# Patient Record
Sex: Female | Born: 1985
Health system: Southern US, Community
[De-identification: ages and names within clinical notes are randomized; demographics above are authoritative.]

## PROBLEM LIST (undated history)

## (undated) DIAGNOSIS — F32A Depression, unspecified: Secondary | ICD-10-CM

## (undated) DIAGNOSIS — F319 Bipolar disorder, unspecified: Secondary | ICD-10-CM

## (undated) DIAGNOSIS — M5430 Sciatica, unspecified side: Secondary | ICD-10-CM

## (undated) DIAGNOSIS — F419 Anxiety disorder, unspecified: Secondary | ICD-10-CM

---

## 2018-03-08 ENCOUNTER — Emergency Department (HOSPITAL_BASED_OUTPATIENT_CLINIC_OR_DEPARTMENT_OTHER): Payer: Medicaid Other

## 2018-03-08 ENCOUNTER — Other Ambulatory Visit: Payer: Self-pay

## 2018-03-08 ENCOUNTER — Emergency Department (HOSPITAL_BASED_OUTPATIENT_CLINIC_OR_DEPARTMENT_OTHER)
Admission: EM | Admit: 2018-03-08 | Discharge: 2018-03-08 | Disposition: A | Discharge status: Home / Self Care | Payer: Medicaid Other | Attending: Emergency Medicine | Admitting: Emergency Medicine

## 2018-03-08 ENCOUNTER — Encounter (HOSPITAL_BASED_OUTPATIENT_CLINIC_OR_DEPARTMENT_OTHER): Payer: Self-pay

## 2018-03-08 DIAGNOSIS — R197 Diarrhea, unspecified: Secondary | ICD-10-CM | POA: Diagnosis not present

## 2018-03-08 DIAGNOSIS — Z3A01 Less than 8 weeks gestation of pregnancy: Secondary | ICD-10-CM | POA: Diagnosis not present

## 2018-03-08 DIAGNOSIS — O219 Vomiting of pregnancy, unspecified: Secondary | ICD-10-CM | POA: Insufficient documentation

## 2018-03-08 DIAGNOSIS — O26891 Other specified pregnancy related conditions, first trimester: Secondary | ICD-10-CM | POA: Insufficient documentation

## 2018-03-08 DIAGNOSIS — R102 Pelvic and perineal pain: Secondary | ICD-10-CM | POA: Diagnosis not present

## 2018-03-08 DIAGNOSIS — Z3490 Encounter for supervision of normal pregnancy, unspecified, unspecified trimester: Secondary | ICD-10-CM

## 2018-03-08 HISTORY — DX: Sciatica, unspecified side: M54.30

## 2018-03-08 LAB — CBC WITH DIFFERENTIAL/PLATELET
Abs Immature Granulocytes: 0.01 10*3/uL (ref 0.00–0.07)
Basophils Absolute: 0 10*3/uL (ref 0.0–0.1)
Basophils Relative: 0 %
Eosinophils Absolute: 0 10*3/uL (ref 0.0–0.5)
Eosinophils Relative: 0 %
HCT: 36.1 % (ref 36.0–46.0)
Hemoglobin: 11.4 g/dL — ABNORMAL LOW (ref 12.0–15.0)
Immature Granulocytes: 0 %
LYMPHS PCT: 34 %
Lymphs Abs: 1.3 10*3/uL (ref 0.7–4.0)
MCH: 27.6 pg (ref 26.0–34.0)
MCHC: 31.6 g/dL (ref 30.0–36.0)
MCV: 87.4 fL (ref 80.0–100.0)
Monocytes Absolute: 0.5 10*3/uL (ref 0.1–1.0)
Monocytes Relative: 13 %
Neutro Abs: 2 10*3/uL (ref 1.7–7.7)
Neutrophils Relative %: 53 %
Platelets: 186 10*3/uL (ref 150–400)
RBC: 4.13 MIL/uL (ref 3.87–5.11)
RDW: 13.3 % (ref 11.5–15.5)
WBC: 3.8 10*3/uL — AB (ref 4.0–10.5)
nRBC: 0 % (ref 0.0–0.2)

## 2018-03-08 LAB — COMPREHENSIVE METABOLIC PANEL
ALT: 9 U/L (ref 0–44)
AST: 14 U/L — ABNORMAL LOW (ref 15–41)
Albumin: 3.9 g/dL (ref 3.5–5.0)
Alkaline Phosphatase: 44 U/L (ref 38–126)
Anion gap: 7 (ref 5–15)
BUN: 7 mg/dL (ref 6–20)
CO2: 24 mmol/L (ref 22–32)
Calcium: 8.6 mg/dL — ABNORMAL LOW (ref 8.9–10.3)
Chloride: 106 mmol/L (ref 98–111)
Creatinine, Ser: 0.82 mg/dL (ref 0.44–1.00)
GFR calc Af Amer: >60 mL/min (ref >60)
GFR calc non Af Amer: >60 mL/min (ref >60)
Glucose, Bld: 104 mg/dL — ABNORMAL HIGH (ref 70–99)
Potassium: 3.5 mmol/L (ref 3.5–5.1)
Sodium: 137 mmol/L (ref 135–145)
Total Bilirubin: 0.4 mg/dL (ref 0.3–1.2)
Total Protein: 6.8 g/dL (ref 6.5–8.1)

## 2018-03-08 LAB — URINALYSIS, MICROSCOPIC (REFLEX)

## 2018-03-08 LAB — URINALYSIS, ROUTINE W REFLEX MICROSCOPIC
Bilirubin Urine: NEGATIVE
Glucose, UA: NEGATIVE mg/dL
Ketones, ur: NEGATIVE mg/dL
NITRITE: POSITIVE — AB
Protein, ur: NEGATIVE mg/dL
Specific Gravity, Urine: 1.025 (ref 1.005–1.030)
pH: 6 (ref 5.0–8.0)

## 2018-03-08 LAB — LIPASE, BLOOD: Lipase: 24 U/L (ref 11–51)

## 2018-03-08 LAB — PREGNANCY, URINE: Preg Test, Ur: POSITIVE — AB

## 2018-03-08 LAB — HCG, QUANTITATIVE, PREGNANCY: hCG, Beta Chain, Quant, S: 12818 m[IU]/mL — ABNORMAL HIGH (ref <5)

## 2018-03-08 MED ORDER — ONDANSETRON HCL 4 MG/2ML IJ SOLN
4.0000 mg | Freq: Once | INTRAMUSCULAR | Status: AC
  Administered 2018-03-08: 4 mg via INTRAVENOUS
  Filled 2018-03-08: qty 2

## 2018-03-08 MED ORDER — SODIUM CHLORIDE 0.9 % IV BOLUS
1000.0000 mL | Freq: Once | INTRAVENOUS | Status: AC
  Administered 2018-03-08: 1000 mL via INTRAVENOUS

## 2018-03-08 MED ORDER — KETOROLAC TROMETHAMINE 30 MG/ML IJ SOLN
30.0000 mg | Freq: Once | INTRAMUSCULAR | Status: AC
  Administered 2018-03-08: 30 mg via INTRAVENOUS
  Filled 2018-03-08: qty 1

## 2018-03-08 NOTE — ED Provider Notes (Signed)
[facility] EMERGENCY DEPARTMENT Provider Note   CSN: [id number] Arrival date & time: 03/08/18  1532    History   Chief Complaint Chief Complaint  Patient presents with  . Abdominal Pain    HPI Megan Yang is a 33 y.o. female.     Patient is a 33 year old female with history of sciatica.  She presents today for evaluation of abdominal pain, nausea, vomiting, and diarrhea.  This is been ongoing for the past 3 days.  All has been nonbloody and nonbilious.  She denies to me she is experiencing any dysuria, abnormal vaginal discharge, or urinary frequency.  She denies any fevers or chills.  She denies any ill contacts.  Her last menstrual period was in January and she believes she is late for her February period.  The history is provided by the patient.  Abdominal Pain  Pain location:  Suprapubic Pain quality: cramping   Pain radiates to:  Does not radiate Pain severity:  Moderate Onset quality:  Gradual Duration:  3 days Timing:  Constant Progression:  Worsening Chronicity:  New Relieved by:  Nothing Worsened by:  Nothing   Past Medical History:  Diagnosis Date  . Sciatica     There are no active problems to display for this patient.   History reviewed. No pertinent surgical history.   OB History   No obstetric history on file.      Home Medications    Prior to Admission medications   Not on File    Family History No family history on file.  Social History Social History   Tobacco Use  . Smoking status: Never Smoker  . Smokeless tobacco: Never Used  Substance Use Topics  . Alcohol use: Yes    Comment: occ  . Drug use: Never     Allergies   Flagyl [metronidazole] and Ivp dye [iodinated diagnostic agents]   Review of Systems Review of Systems  Gastrointestinal: Positive for abdominal pain.  All other systems reviewed and are negative.    Physical Exam Updated Vital Signs BP 110/73 (BP Location: Left Arm)   Pulse 94    Temp 99.1 F (37.3 C) (Oral)   Resp 20   Ht 5\' 5"  (1.651 m)   Wt 99.8 kg   LMP  (LMP Unknown)   SpO2 99%   BMI 36.61 kg/m   Physical Exam Vitals signs and nursing note reviewed.  Constitutional:      General: She is not in acute distress.    Appearance: She is well-developed. She is not diaphoretic.  HENT:     Head: Normocephalic and atraumatic.  Neck:     Musculoskeletal: Normal range of motion and neck supple.  Cardiovascular:     Rate and Rhythm: Normal rate and regular rhythm.     Heart sounds: No murmur. No friction rub. No gallop.   Pulmonary:     Effort: Pulmonary effort is normal. No respiratory distress.     Breath sounds: Normal breath sounds. No wheezing.  Abdominal:     General: Bowel sounds are normal. There is no distension.     Palpations: Abdomen is soft.     Tenderness: There is abdominal tenderness in the suprapubic area. There is no right CVA tenderness or left CVA tenderness.     Comments: There is mild tenderness in the suprapubic region.  Musculoskeletal: Normal range of motion.  Skin:    General: Skin is warm and dry.  Neurological:     Mental Status: She is  alert and oriented to person, place, and time.      ED Treatments / Results  Labs (all labs ordered are listed, but only abnormal results are displayed) Labs Reviewed  URINALYSIS, ROUTINE W REFLEX MICROSCOPIC  PREGNANCY, URINE  COMPREHENSIVE METABOLIC PANEL  LIPASE, BLOOD  CBC WITH DIFFERENTIAL/PLATELET    EKG None  Radiology No results found.  Procedures Procedures (including critical care time)  Medications Ordered in ED Medications  sodium chloride 0.9 % bolus 1,000 mL (has no administration in time range)  ondansetron (ZOFRAN) injection 4 mg (has no administration in time range)  ketorolac (TORADOL) 30 MG/ML injection 30 mg (has no administration in time range)     Initial Impression / Assessment and Plan / ED Course  I have reviewed the triage vital signs and the  nursing notes.  Pertinent labs & imaging results that were available during my care of the patient were reviewed by me and considered in my medical decision making (see chart for details).  Patient presents here with suprapubic abdominal pain, nausea, and vomiting for the past week.  Patient's pregnancy test was positive and this was unbeknownst to her.  She is not having any bleeding and laboratory studies are reassuring.  Ultrasound shows an intrauterine pregnancy with no complicating features, but is early on in gestation.  Radiology is recommending a repeat ultrasound in 10 to 14 days to assess for fetal heart activity.  Patient will be discharged with OB follow-up.  Final Clinical Impressions(s) / ED Diagnoses   Final diagnoses:  None    ED Discharge Orders    None       Geoffery Lyons, MD 03/08/18 [name]

## 2018-03-08 NOTE — ED Triage Notes (Signed)
C/o abd pain n/v/d x 3 days-NAD-steady gait

## 2018-03-08 NOTE — ED Notes (Signed)
PT states understanding of care given, follow up care. PT ambulated from ED to car with a steady gait.  

## 2018-03-08 NOTE — Discharge Instructions (Addendum)
Follow-up with your OB/GYN in the next 10 to 14 days.  Radiology is recommending that you have a repeat ultrasound in the next 2 weeks to further evaluate for fetal heart activity.

## 2018-03-08 NOTE — ED Notes (Signed)
Pt resting on stretcher, texting on phone. No distress noted.

## 2018-03-08 NOTE — ED Notes (Signed)
Patient transported to Ultrasound 

## 2018-03-08 NOTE — ED Notes (Signed)
ED Provider at bedside. 

## 2018-06-09 ENCOUNTER — Other Ambulatory Visit: Payer: Self-pay

## 2018-06-09 ENCOUNTER — Encounter (HOSPITAL_BASED_OUTPATIENT_CLINIC_OR_DEPARTMENT_OTHER): Payer: Self-pay | Admitting: *Deleted

## 2018-06-09 ENCOUNTER — Emergency Department (HOSPITAL_BASED_OUTPATIENT_CLINIC_OR_DEPARTMENT_OTHER)
Admission: EM | Admit: 2018-06-09 | Discharge: 2018-06-09 | Disposition: A | Discharge status: Home / Self Care | Payer: Medicaid Other | Attending: Emergency Medicine | Admitting: Emergency Medicine

## 2018-06-09 DIAGNOSIS — Z79899 Other long term (current) drug therapy: Secondary | ICD-10-CM | POA: Diagnosis not present

## 2018-06-09 DIAGNOSIS — N939 Abnormal uterine and vaginal bleeding, unspecified: Secondary | ICD-10-CM | POA: Insufficient documentation

## 2018-06-09 LAB — PREGNANCY, URINE: Preg Test, Ur: NEGATIVE

## 2018-06-09 LAB — WET PREP, GENITAL
Sperm: NONE SEEN
Trich, Wet Prep: NONE SEEN
Yeast Wet Prep HPF POC: NONE SEEN

## 2018-06-09 NOTE — ED Triage Notes (Signed)
Vaginal bleeding on and off for a month. She was started on birth control prior to the bleeding. States she had an elective abortion in February.

## 2018-06-09 NOTE — ED Provider Notes (Signed)
[facility] EMERGENCY DEPARTMENT Provider Note   CSN: [id number] Arrival date & time: 06/09/18  1449    History   Chief Complaint Chief Complaint  Patient presents with  . Vaginal Bleeding    HPI Megan Yang is a 33 y.o. female.     The history is provided by the patient.  Vaginal Bleeding  Quality:  Lighter than menses Severity:  Mild Onset quality:  Gradual Duration:  4 weeks Timing:  Intermittent Progression:  Waxing and waning Chronicity:  New Menstrual history:  Irregular Number of tampons used:  1-2 a day if needed Possible pregnancy: no   Context comment:  Just restarted birth control, had elective abortion months ago without any issues. Bleeding started shortly after restarting birth control. No abdominal pain.  Relieved by:  Nothing Worsened by:  Nothing Associated symptoms: no abdominal pain, no back pain, no dizziness, no dyspareunia, no dysuria, no fatigue, no fever, no nausea and no vaginal discharge   Risk factors: no STD     Past Medical History:  Diagnosis Date  . Sciatica     There are no active problems to display for this patient.   History reviewed. No pertinent surgical history.   OB History   No obstetric history on file.      Home Medications    Prior to Admission medications   Medication Sig Start Date End Date Taking? Authorizing Provider  levonorgestrel-ethinyl estradiol (SEASONALE) 0.15-0.03 MG tablet Take 1 tablet by mouth daily.   Yes [provider]    Family History No family history on file.  Social History Social History   Tobacco Use  . Smoking status: Never Smoker  . Smokeless tobacco: Never Used  Substance Use Topics  . Alcohol use: Yes    Comment: occ  . Drug use: Never     Allergies   Flagyl [metronidazole] and Ivp dye [iodinated diagnostic agents]   Review of Systems Review of Systems  Constitutional: Negative for chills, fatigue and fever.  HENT: Negative for ear pain and  sore throat.   Eyes: Negative for pain and visual disturbance.  Respiratory: Negative for cough and shortness of breath.   Cardiovascular: Negative for chest pain and palpitations.  Gastrointestinal: Negative for abdominal pain, nausea and vomiting.  Genitourinary: Positive for vaginal bleeding. Negative for dyspareunia, dysuria, hematuria and vaginal discharge.  Musculoskeletal: Negative for arthralgias and back pain.  Skin: Negative for color change and rash.  Neurological: Negative for dizziness, seizures and syncope.  All other systems reviewed and are negative.    Physical Exam Updated Vital Signs  ED Triage Vitals  Enc Vitals Group     BP 06/09/18 1505 (!) 122/91     Pulse Rate 06/09/18 1505 97     Resp 06/09/18 1505 20     Temp 06/09/18 1505 98.2 F (36.8 C)     Temp Source 06/09/18 1505 Oral     SpO2 06/09/18 1505 97 %     Weight 06/09/18 1503 220 lb (99.8 kg)     Height 06/09/18 1503 5\' 5"  (1.651 m)     Head Circumference --      Peak Flow --      Pain Score 06/09/18 1503 2     Pain Loc --      Pain Edu? --      Excl. in GC? --     Physical Exam Vitals signs and nursing note reviewed.  Constitutional:      General: She is not  in acute distress.    Appearance: She is well-developed.  HENT:     Head: Normocephalic and atraumatic.  Eyes:     Conjunctiva/sclera: Conjunctivae normal.  Neck:     Musculoskeletal: Neck supple.  Cardiovascular:     Rate and Rhythm: Normal rate and regular rhythm.     Pulses: Normal pulses.     Heart sounds: Normal heart sounds. No murmur.  Pulmonary:     Effort: Pulmonary effort is normal. No respiratory distress.     Breath sounds: Normal breath sounds.  Abdominal:     Palpations: Abdomen is soft.     Tenderness: There is no abdominal tenderness.  Genitourinary:    Vagina: Bleeding (scant blood in vaginal vault) present. No vaginal discharge or erythema.     Cervix: No cervical motion tenderness or discharge.  Skin:     General: Skin is warm and dry.  Neurological:     Mental Status: She is alert.      ED Treatments / Results  Labs (all labs ordered are listed, but only abnormal results are displayed) Labs Reviewed  WET PREP, GENITAL - Abnormal; Notable for the following components:      Result Value   Clue Cells Wet Prep HPF POC PRESENT (*)    WBC, Wet Prep HPF POC MODERATE (*)    All other components within normal limits  PREGNANCY, URINE    EKG None  Radiology No results found.  Procedures Procedures (including critical care time)  Medications Ordered in ED Medications - No data to display   Initial Impression / Assessment and Plan / ED Course  I have reviewed the triage vital signs and the nursing notes.  Pertinent labs & imaging results that were available during my care of the patient were reviewed by me and considered in my medical decision making (see chart for details).     Megan Yang is a 33 year old female here for vaginal bleeding.  Patient has had vaginal bleeding on and off for the past month.  She restarted birth control this past month.  Patient did have an elective abortion several months ago.  She has not had any fevers, pain.  Suspect that she likely has abnormal uterine bleeding.  There is a scant bleeding on exam.  Wet prep is unremarkable.  Positive for clue cells but she is asymptomatic and will not treat BV.  Suspect that bleeding is likely related to restarting hormonal birth control.  Likely will become regular.  Recommend that she follow-up at wound clinic.  She states that she has had bleeding enough to have to change a tampon maybe once or twice a day.  She has had some days where she is had no bleeding during this time as well.  Pregnancy test was negative.  Patient given reassurance and discharged from ED in good condition.  This chart was dictated using voice recognition software.  Despite best efforts to proofread,  errors can occur which can change the  documentation meaning.    Final Clinical Impressions(s) / ED Diagnoses   Final diagnoses:  Vaginal bleeding    ED Discharge Orders    None       Virgina Norfolk, DO 06/09/18 1637

## 2019-01-31 ENCOUNTER — Emergency Department (HOSPITAL_BASED_OUTPATIENT_CLINIC_OR_DEPARTMENT_OTHER)
Admission: EM | Admit: 2019-01-31 | Discharge: 2019-01-31 | Disposition: A | Discharge status: Home / Self Care | Payer: Medicaid Other | Attending: Emergency Medicine | Admitting: Emergency Medicine

## 2019-01-31 ENCOUNTER — Other Ambulatory Visit: Payer: Self-pay

## 2019-01-31 ENCOUNTER — Encounter (HOSPITAL_BASED_OUTPATIENT_CLINIC_OR_DEPARTMENT_OTHER): Payer: Self-pay | Admitting: *Deleted

## 2019-01-31 DIAGNOSIS — N3000 Acute cystitis without hematuria: Secondary | ICD-10-CM | POA: Diagnosis not present

## 2019-01-31 DIAGNOSIS — N390 Urinary tract infection, site not specified: Secondary | ICD-10-CM

## 2019-01-31 DIAGNOSIS — Z91041 Radiographic dye allergy status: Secondary | ICD-10-CM | POA: Diagnosis not present

## 2019-01-31 DIAGNOSIS — Z20822 Contact with and (suspected) exposure to covid-19: Secondary | ICD-10-CM | POA: Insufficient documentation

## 2019-01-31 DIAGNOSIS — J020 Streptococcal pharyngitis: Secondary | ICD-10-CM | POA: Diagnosis not present

## 2019-01-31 DIAGNOSIS — Z793 Long term (current) use of hormonal contraceptives: Secondary | ICD-10-CM | POA: Insufficient documentation

## 2019-01-31 DIAGNOSIS — Z881 Allergy status to other antibiotic agents status: Secondary | ICD-10-CM | POA: Diagnosis not present

## 2019-01-31 DIAGNOSIS — R509 Fever, unspecified: Secondary | ICD-10-CM | POA: Diagnosis present

## 2019-01-31 LAB — URINALYSIS, ROUTINE W REFLEX MICROSCOPIC
Bilirubin Urine: NEGATIVE
Glucose, UA: NEGATIVE mg/dL
Ketones, ur: NEGATIVE mg/dL
Nitrite: POSITIVE — AB
Protein, ur: NEGATIVE mg/dL
Specific Gravity, Urine: 1.02 (ref 1.005–1.030)
pH: 6.5 (ref 5.0–8.0)

## 2019-01-31 LAB — PREGNANCY, URINE: Preg Test, Ur: NEGATIVE

## 2019-01-31 LAB — URINALYSIS, MICROSCOPIC (REFLEX)

## 2019-01-31 LAB — GROUP A STREP BY PCR: Group A Strep by PCR: DETECTED — AB

## 2019-01-31 MED ORDER — CEPHALEXIN 250 MG PO CAPS
500.0000 mg | ORAL_CAPSULE | Freq: Once | ORAL | Status: AC
  Administered 2019-01-31: 500 mg via ORAL
  Filled 2019-01-31: qty 2

## 2019-01-31 MED ORDER — PENICILLIN G BENZATHINE 1200000 UNIT/2ML IM SUSP
1.2000 10*6.[IU] | Freq: Once | INTRAMUSCULAR | Status: AC
  Administered 2019-01-31: 1.2 10*6.[IU] via INTRAMUSCULAR
  Filled 2019-01-31: qty 2

## 2019-01-31 MED ORDER — CEPHALEXIN 500 MG PO CAPS: 500.0000 mg | ORAL_CAPSULE | Freq: Two times a day (BID) | ORAL | 0 refills | Status: AC

## 2019-01-31 MED ORDER — ONDANSETRON 4 MG PO TBDP
4.0000 mg | ORAL_TABLET | Freq: Once | ORAL | Status: AC
  Administered 2019-01-31: 4 mg via ORAL
  Filled 2019-01-31: qty 1

## 2019-01-31 NOTE — ED Triage Notes (Signed)
Pt c/o covid symptoms x 1 day

## 2019-01-31 NOTE — ED Provider Notes (Signed)
[facility] EMERGENCY DEPARTMENT Provider Note   CSN: [id number] Arrival date & time: 01/31/19  1538     History Chief Complaint  Patient presents with  . Fever    Megan Yang is a 34 y.o. female.  Patient with a history of sciatica presenting with multiple complaints.  States she has had right-sided low back pain for the past 3 days that radiates down her right leg.  There is been no associated numbness, tingling, bowel or bladder incontinence.  No vomiting.  Today she developed a fever at home up to 102 as well as some sore throat and pain with urination or frequency.  Denies any known coronavirus exposures.  Denies any IV drug abuse or history of cancer.  No previous back surgery.  Reports sore throat but no body aches.  No cough, chest pain or shortness of breath.  No abdominal pain, nausea or vomiting.  Does have some suprapubic tenderness and urgency and frequency.  Denies any sick contacts. Denies any loss of control of her bowels or her bladder.  The history is provided by the patient.  Fever Associated symptoms: dysuria, myalgias and sore throat   Associated symptoms: no chest pain, no congestion, no cough, no headaches, no nausea, no rhinorrhea and no vomiting        Past Medical History:  Diagnosis Date  . Sciatica     There are no problems to display for this patient.   History reviewed. No pertinent surgical history.   OB History   No obstetric history on file.     History reviewed. No pertinent family history.  Social History   Tobacco Use  . Smoking status: Never Smoker  . Smokeless tobacco: Never Used  Substance Use Topics  . Alcohol use: Yes    Comment: occ  . Drug use: Never    Home Medications Prior to Admission medications   Medication Sig Start Date End Date Taking? Authorizing Provider  levonorgestrel-ethinyl estradiol (SEASONALE) 0.15-0.03 MG tablet Take 1 tablet by mouth daily.    [provider]    Allergies      Flagyl [metronidazole] and Ivp dye [iodinated diagnostic agents]  Review of Systems   Review of Systems  Constitutional: Positive for fatigue and fever. Negative for activity change and appetite change.  HENT: Positive for sore throat. Negative for congestion and rhinorrhea.   Respiratory: Negative for cough, chest tightness and shortness of breath.   Cardiovascular: Negative for chest pain.  Gastrointestinal: Negative for abdominal pain, nausea and vomiting.  Genitourinary: Positive for dysuria, frequency, hematuria and urgency.  Musculoskeletal: Positive for arthralgias, back pain and myalgias.  Neurological: Negative for dizziness, weakness and headaches.   all other systems are negative except as noted in the HPI and PMH.    Physical Exam Updated Vital Signs BP (!) 141/96   Pulse (!) 114   Temp 99.1 F (37.3 C)   Resp 18   Ht 5\' 5"  (1.651 m)   Wt 96.2 kg   LMP 01/16/2019   SpO2 100%   BMI 35.28 kg/m   Physical Exam Vitals and nursing note reviewed.  Constitutional:      General: She is not in acute distress.    Appearance: Normal appearance. She is well-developed and normal weight.  HENT:     Head: Normocephalic and atraumatic.     Mouth/Throat:     Pharynx: Posterior oropharyngeal erythema present. No oropharyngeal exudate.     Comments: Erythema without exudate or asymmetry Eyes:  Conjunctiva/sclera: Conjunctivae normal.     Pupils: Pupils are equal, round, and reactive to light.  Neck:     Comments: No meningismus. Cardiovascular:     Rate and Rhythm: Regular rhythm. Tachycardia present.     Heart sounds: Normal heart sounds. No murmur.  Pulmonary:     Effort: Pulmonary effort is normal. No respiratory distress.     Breath sounds: Normal breath sounds.  Abdominal:     Palpations: Abdomen is soft.     Tenderness: There is abdominal tenderness. There is no guarding or rebound.     Comments: Suprapubic tenderness  Musculoskeletal:        General:  Tenderness present. Normal range of motion.     Cervical back: Normal range of motion and neck supple.     Comments: Right paraspinal lumbar tenderness, no midline tenderness  5/5 strength in bilateral lower extremities. Ankle plantar and dorsiflexion intact. Great toe extension intact bilaterally. +2 DP and PT pulses. +2 patellar reflexes bilaterally. Normal gait.   Skin:    General: Skin is warm.     Capillary Refill: Capillary refill takes less than 2 seconds.  Neurological:     General: No focal deficit present.     Mental Status: She is alert and oriented to person, place, and time. Mental status is at baseline.     Cranial Nerves: No cranial nerve deficit.     Motor: No abnormal muscle tone.     Coordination: Coordination normal.     Comments: No ataxia on finger to nose bilaterally. No pronator drift. 5/5 strength throughout. CN 2-12 intact.Equal grip strength. Sensation intact.   Psychiatric:        Behavior: Behavior normal.     ED Results / Procedures / Treatments   Labs (all labs ordered are listed, but only abnormal results are displayed) Labs Reviewed  GROUP A STREP BY PCR - Abnormal; Notable for the following components:      Result Value   Group A Strep by PCR DETECTED (*)    All other components within normal limits  URINALYSIS, ROUTINE W REFLEX MICROSCOPIC - Abnormal; Notable for the following components:   APPearance CLOUDY (*)    Hgb urine dipstick SMALL (*)    Nitrite POSITIVE (*)    Leukocytes,Ua SMALL (*)    All other components within normal limits  URINALYSIS, MICROSCOPIC (REFLEX) - Abnormal; Notable for the following components:   Bacteria, UA MANY (*)    All other components within normal limits  NOVEL CORONAVIRUS, NAA (HOSP ORDER, SEND-OUT TO REF LAB; TAT 18-24 HRS)  URINE CULTURE  PREGNANCY, URINE    EKG None  Radiology No results found.  Procedures Procedures (including critical care time)  Medications Ordered in ED Medications    cephALEXin (KEFLEX) capsule 500 mg (has no administration in time range)  ondansetron (ZOFRAN-ODT) disintegrating tablet 4 mg (has no administration in time range)    ED Course  I have reviewed the triage vital signs and the nursing notes.  Pertinent labs & imaging results that were available during my care of the patient were reviewed by me and considered in my medical decision making (see chart for details).    MDM Rules/Calculators/A&P                     Patient multiple complaints including low back pain, sore throat and fever.  Has had back pain for several days as well as as well as frequency and urgency.  Urinalysis  is suspicious for infection.  Will send culture.  Will check rapid strep.  No neurological deficits.  Low suspicion for cord compression or cauda equina.  Strep is positive.  Patient treated with IM Bicillin.  Will treat also for suspected UTI and possible pyelonephritis.  Advised to keep her self quarantined while her coronavirus test is pending. Low suspicion for epidural abscess, cord compression or cauda equina.  Discussed p.o. hydration, antipyretics, antibiotics and PCP follow-up.  Return precautions discussed.  Megan Yang was evaluated in Emergency Department on 01/31/2019 for the symptoms described in the history of present illness. She was evaluated in the context of the global COVID-19 pandemic, which necessitated consideration that the patient might be at risk for infection with the SARS-CoV-2 virus that causes COVID-19. Institutional protocols and algorithms that pertain to the evaluation of patients at risk for COVID-19 are in a state of rapid change based on information released by regulatory bodies including the CDC and federal and state organizations. These policies and algorithms were followed during the patient's care in the ED.  Final Clinical Impression(s) / ED Diagnoses Final diagnoses:  Strep throat  Urinary tract infection without hematuria,  site unspecified    Rx / DC Orders ED Discharge Orders         Ordered    cephALEXin (KEFLEX) 500 MG capsule  2 times daily     01/31/19 1918           Glynn Octave, MD 01/31/19 2102

## 2019-01-31 NOTE — Discharge Instructions (Signed)
Your fever strep throat.  Take the antibiotics as prescribed for a urinary tract infection and follow-up with your doctor.  Keep yourself quarantined while your coronavirus test is pending.  Use Tylenol or Motrin as needed for aches and fever.  Return to the ED with new or worsening symptoms including worsening back pain, unilateral numbness, tingling, bowel or bladder incontinence, any other concerns.

## 2019-02-01 LAB — NOVEL CORONAVIRUS, NAA (HOSP ORDER, SEND-OUT TO REF LAB; TAT 18-24 HRS): SARS-CoV-2, NAA: NOT DETECTED

## 2019-02-03 LAB — URINE CULTURE
Culture: >=100000 — AB
Special Requests: NORMAL

## 2019-02-04 ENCOUNTER — Telehealth: Payer: Self-pay | Admitting: Emergency Medicine

## 2019-02-04 NOTE — Telephone Encounter (Signed)
Post ED Visit - Positive Culture Follow-up  Culture report reviewed by antimicrobial stewardship pharmacist: [facility] []  , Pharm.D. []  Enzo Bi, Pharm.D., BCPS AQ-ID []  , Pharm.D., BCPS []  Celedonio Miyamoto, Pharm.D., BCPS []  [place], Garvin Fila.D., BCPS, AAHIVP []  , Pharm.D., BCPS, AAHIVP []  Georgina Pillion, PharmD, BCPS []  , PharmD, BCPS [x]  [place], PharmD, BCPS []  [address], PharmD []  , PharmD, BCPS []  Estella Husk, PharmD  Pharmacy Team []  Lysle Pearl, PharmD []  , PharmD []  Phillips Climes, PharmD []  , Rph []  Agapito Games) , PharmD []  Verlan Friends, PharmD []  , PharmD []  Mervyn Gay, PharmD []  , PharmD []  Vinnie Level, PharmD []  [facility], PharmD []  , PharmD []  Len Childs, PharmD   Positive urine culture Treated with Cephalexin, organism sensitive to the same and no further patient follow-up is required at this time.  02/04/2019, 4:35 PM

## 2019-07-13 ENCOUNTER — Emergency Department (HOSPITAL_BASED_OUTPATIENT_CLINIC_OR_DEPARTMENT_OTHER)
Admission: EM | Admit: 2019-07-13 | Discharge: 2019-07-13 | Disposition: A | Discharge status: Home / Self Care | Payer: Medicaid Other | Attending: Emergency Medicine | Admitting: Emergency Medicine

## 2019-07-13 ENCOUNTER — Other Ambulatory Visit: Payer: Self-pay

## 2019-07-13 ENCOUNTER — Encounter (HOSPITAL_BASED_OUTPATIENT_CLINIC_OR_DEPARTMENT_OTHER): Payer: Self-pay

## 2019-07-13 DIAGNOSIS — H6001 Abscess of right external ear: Secondary | ICD-10-CM | POA: Insufficient documentation

## 2019-07-13 DIAGNOSIS — H9201 Otalgia, right ear: Secondary | ICD-10-CM | POA: Diagnosis present

## 2019-07-13 DIAGNOSIS — L539 Erythematous condition, unspecified: Secondary | ICD-10-CM | POA: Diagnosis not present

## 2019-07-13 MED ORDER — DOXYCYCLINE HYCLATE 100 MG PO CAPS: 100.0000 mg | ORAL_CAPSULE | Freq: Two times a day (BID) | ORAL | 0 refills | Status: AC

## 2019-07-13 MED FILL — DOXYCYCLINE HYCLATE 100 MG: 100 | 7 days supply | Qty: 14 | Fill #0

## 2019-07-13 NOTE — Discharge Instructions (Signed)
Place a warm compress on the outside of your ear.  Also try tylenol and ibuprofen for pain.  Do not stick anything inside your ear.

## 2019-07-13 NOTE — ED Provider Notes (Signed)
[facility] EMERGENCY DEPARTMENT Provider Note   CSN: [id number] Arrival date & time: 07/13/19  1236     History Chief Complaint  Patient presents with  . URI    Megan Yang is a 34 y.o. female.  The history is provided by the patient.  URI Presenting symptoms: ear pain   Presenting symptoms comment:  Patient reports that last week she had URI symptoms with congestion, cough, bronchitis-like symptoms which are all improving but she has had worsening right ear pain for the last 5 days.  Her ear canal and outside is very tender even to the touch.  She reports that it seems like her hearing is decreased on the left side.  She has had no drainage from the ear or fever.  She has not put anything into her ear. Severity:  Moderate Onset quality:  Gradual Duration:  5 days Timing:  Constant Progression:  Worsening Chronicity:  New Relieved by:  None tried Worsened by:  Certain positions (palpation) Ineffective treatments:  None tried Associated symptoms: no neck pain and no sinus pain   Risk factors: no sick contacts   Risk factors comment:  Healthy 34 y/o female      Past Medical History:  Diagnosis Date  . Sciatica     There are no problems to display for this patient.   History reviewed. No pertinent surgical history.   OB History   No obstetric history on file.     No family history on file.  Social History   Tobacco Use  . Smoking status: Never Smoker  . Smokeless tobacco: Never Used  Vaping Use  . Vaping Use: Never used  Substance Use Topics  . Alcohol use: Yes    Comment: occ  . Drug use: Never    Home Medications Prior to Admission medications   Medication Sig Start Date End Date Taking? Authorizing Provider  cephALEXin (KEFLEX) 500 MG capsule Take 1 capsule (500 mg total) by mouth 2 (two) times daily. 01/31/19   Rancour, Jeannett Senior, MD  doxycycline (VIBRAMYCIN) 100 MG capsule Take 1 capsule (100 mg total) by mouth 2 (two) times daily.  07/13/19   Gwyneth Sprout, MD  ibuprofen (ADVIL) 800 MG tablet Take 800 mg by mouth every 6 (six) hours as needed for moderate pain.    [provider]    Allergies    Flagyl [metronidazole] and Ivp dye [iodinated diagnostic agents]  Review of Systems   Review of Systems  HENT: Positive for ear pain. Negative for sinus pain.   Musculoskeletal: Negative for neck pain.  All other systems reviewed and are negative.   Physical Exam Updated Vital Signs BP 121/88 (BP Location: Left Arm)   Pulse 88   Temp 98.8 F (37.1 C) (Oral)   Resp 16   Ht 5\' 5"  (1.651 m)   Wt 98 kg   LMP 06/23/2019   SpO2 100%   BMI 35.94 kg/m   Physical Exam Vitals and nursing note reviewed.  Constitutional:      General: She is not in acute distress.    Appearance: Normal appearance. She is well-developed. She is obese.  HENT:     Head: Normocephalic and atraumatic.     Right Ear: Tympanic membrane normal. Swelling present. No drainage. No middle ear effusion. There is no impacted cerumen. Tympanic membrane is not injected.     Left Ear: There is impacted cerumen.     Ears:      Nose: Congestion present.  Mouth/Throat:     Mouth: Mucous membranes are moist.     Pharynx: Posterior oropharyngeal erythema present. No oropharyngeal exudate.  Eyes:     Conjunctiva/sclera: Conjunctivae normal.     Pupils: Pupils are equal, round, and reactive to light.  Cardiovascular:     Rate and Rhythm: Normal rate and regular rhythm.     Heart sounds: No murmur heard.   Pulmonary:     Effort: Pulmonary effort is normal. No respiratory distress.     Breath sounds: Normal breath sounds. No wheezing or rales.  Abdominal:     Palpations: Abdomen is soft.  Musculoskeletal:        General: No tenderness. Normal range of motion.     Cervical back: Normal range of motion and neck supple.  Skin:    General: Skin is warm and dry.     Findings: No erythema or rash.  Neurological:     General: No focal  deficit present.     Mental Status: She is alert and oriented to person, place, and time. Mental status is at baseline.  Psychiatric:        Mood and Affect: Mood normal.        Behavior: Behavior normal.        Thought Content: Thought content normal.     ED Results / Procedures / Treatments   Labs (all labs ordered are listed, but only abnormal results are displayed) Labs Reviewed - No data to display  EKG None  Radiology No results found.  Procedures Procedures (including critical care time)  Medications Ordered in ED Medications - No data to display  ED Course  I have reviewed the triage vital signs and the nursing notes.  Pertinent labs & imaging results that were available during my care of the patient were reviewed by me and considered in my medical decision making (see chart for details).    MDM Rules/Calculators/A&P                          Patient presents today with right ear pain and exam findings most consistent with a developing abscess of the right ear canal.  There is swelling, localized tenderness and mild erythema.  No mastoid tenderness or evidence of otitis media.  Will start patient on doxycycline and have her use warm compresses.  Final Clinical Impression(s) / ED Diagnoses Final diagnoses:  Abscess of ear canal, right    Rx / DC Orders ED Discharge Orders         Ordered    doxycycline (VIBRAMYCIN) 100 MG capsule  2 times daily     Discontinue  Reprint     07/13/19 1344           Gwyneth Sprout, MD 07/13/19 1407

## 2019-07-13 NOTE — ED Triage Notes (Signed)
Pt c/o sinus congestion, cough last week-right earache x 5 days-NAD-steady gait

## 2019-09-26 ENCOUNTER — Emergency Department (HOSPITAL_BASED_OUTPATIENT_CLINIC_OR_DEPARTMENT_OTHER): Payer: Medicaid Other

## 2019-09-26 ENCOUNTER — Other Ambulatory Visit: Payer: Self-pay

## 2019-09-26 ENCOUNTER — Encounter (HOSPITAL_BASED_OUTPATIENT_CLINIC_OR_DEPARTMENT_OTHER): Payer: Self-pay | Admitting: *Deleted

## 2019-09-26 ENCOUNTER — Emergency Department (HOSPITAL_BASED_OUTPATIENT_CLINIC_OR_DEPARTMENT_OTHER)
Admission: EM | Admit: 2019-09-26 | Discharge: 2019-09-26 | Disposition: A | Discharge status: Home / Self Care | Payer: Medicaid Other | Attending: Emergency Medicine | Admitting: Emergency Medicine

## 2019-09-26 DIAGNOSIS — R002 Palpitations: Secondary | ICD-10-CM | POA: Diagnosis present

## 2019-09-26 DIAGNOSIS — Z79899 Other long term (current) drug therapy: Secondary | ICD-10-CM | POA: Insufficient documentation

## 2019-09-26 HISTORY — DX: Depression, unspecified: F32.A

## 2019-09-26 HISTORY — DX: Bipolar disorder, unspecified: F31.9

## 2019-09-26 HISTORY — DX: Anxiety disorder, unspecified: F41.9

## 2019-09-26 LAB — TROPONIN I (HIGH SENSITIVITY): Troponin I (High Sensitivity): <2 ng/L (ref <18)

## 2019-09-26 LAB — PREGNANCY, URINE: Preg Test, Ur: NEGATIVE

## 2019-09-26 NOTE — ED Provider Notes (Signed)
[facility] EMERGENCY DEPARTMENT Provider Note   CSN: [id number] Arrival date & time: 09/26/19  0043     History Chief Complaint  Patient presents with  . Palpitations    Megan Yang is a 34 y.o. female.  The history is provided by the patient.  Palpitations Palpitations quality:  Fast Onset quality:  Sudden Timing:  Constant Chronicity:  New Context: anxiety   Relieved by:  Nothing Worsened by:  Nothing Ineffective treatments:  None tried Associated symptoms: no back pain, no chest pain, no chest pressure, no cough, no diaphoresis, no dizziness, no hemoptysis, no leg pain, no lower extremity edema, no malaise/fatigue, no nausea, no near-syncope, no numbness, no orthopnea, no PND, no shortness of breath, no syncope, no vomiting and no weakness   No alcohol no caffeine.  No OCP.  No leg pain no swelling no travel.      Past Medical History:  Diagnosis Date  . Anxiety   . Bipolar 1 disorder (HCC)   . Depression   . Sciatica     There are no problems to display for this patient.   History reviewed. No pertinent surgical history.   OB History   No obstetric history on file.     History reviewed. No pertinent family history.  Social History   Tobacco Use  . Smoking status: Never Smoker  . Smokeless tobacco: Never Used  Vaping Use  . Vaping Use: Never used  Substance Use Topics  . Alcohol use: Yes    Comment: occ  . Drug use: Never    Home Medications Prior to Admission medications   Medication Sig Start Date End Date Taking? Authorizing Provider  cephALEXin (KEFLEX) 500 MG capsule Take 1 capsule (500 mg total) by mouth 2 (two) times daily. 01/31/19   Rancour, Jeannett Senior, MD  doxycycline (VIBRAMYCIN) 100 MG capsule Take 1 capsule (100 mg total) by mouth 2 (two) times daily. 07/13/19   Gwyneth Sprout, MD  ibuprofen (ADVIL) 800 MG tablet Take 800 mg by mouth every 6 (six) hours as needed for moderate pain.    [provider]     Allergies    Flagyl [metronidazole] and Ivp dye [iodinated diagnostic agents]  Review of Systems   Review of Systems  Constitutional: Negative for diaphoresis and malaise/fatigue.  HENT: Negative for congestion.   Respiratory: Negative for cough, hemoptysis and shortness of breath.   Cardiovascular: Positive for palpitations. Negative for chest pain, orthopnea, syncope, PND and near-syncope.  Gastrointestinal: Negative for nausea and vomiting.  Genitourinary: Negative for difficulty urinating.  Musculoskeletal: Negative for back pain.  Neurological: Negative for dizziness, weakness and numbness.  Psychiatric/Behavioral: Negative for agitation.  All other systems reviewed and are negative.   Physical Exam Updated Vital Signs BP 122/90 (BP Location: Left Arm)   Pulse 79   Temp 98.6 F (37 C) (Oral)   Resp 16   Ht 5\' 5"  (1.651 m)   Wt 99.8 kg   SpO2 100%   BMI 36.61 kg/m   Physical Exam Vitals and nursing note reviewed.  Constitutional:      General: She is not in acute distress.    Appearance: Normal appearance.  HENT:     Head: Normocephalic and atraumatic.     Nose: Nose normal.  Eyes:     Conjunctiva/sclera: Conjunctivae normal.     Pupils: Pupils are equal, round, and reactive to light.  Cardiovascular:     Rate and Rhythm: Normal rate and regular rhythm.  Pulses: Normal pulses.     Heart sounds: Normal heart sounds.  Pulmonary:     Effort: Pulmonary effort is normal.     Breath sounds: Normal breath sounds.  Abdominal:     General: Abdomen is flat. Bowel sounds are normal.     Palpations: Abdomen is soft.     Tenderness: There is no abdominal tenderness. There is no guarding.  Musculoskeletal:     Cervical back: Normal range of motion and neck supple.     Right lower leg: No edema.     Left lower leg: No edema.  Skin:    General: Skin is warm and dry.     Capillary Refill: Capillary refill takes less than 2 seconds.  Neurological:     General:  No focal deficit present.     Mental Status: She is alert and oriented to person, place, and time.  Psychiatric:        Thought Content: Thought content normal.     ED Results / Procedures / Treatments   Labs (all labs ordered are listed, but only abnormal results are displayed) Results for orders placed or performed during the hospital encounter of 09/26/19  Pregnancy, urine  Result Value Ref Range   Preg Test, Ur NEGATIVE NEGATIVE  Troponin I (High Sensitivity)  Result Value Ref Range   Troponin I (High Sensitivity) <2 <18 ng/L   DG Chest Portable 1 View  Result Date: 09/26/2019 CLINICAL DATA:  Palpitations EXAM: PORTABLE CHEST 1 VIEW COMPARISON:  None. FINDINGS: The heart size and mediastinal contours are within normal limits. Both lungs are clear. The visualized skeletal structures are unremarkable. IMPRESSION: No active disease. Electronically Signed   By: Jasmine Pang M.D.   On: 09/26/2019 03:57    EKG EKG Interpretation  Date/Time:  Wednesday September 26 2019 00:55:29 EDT Ventricular Rate:  91 PR Interval:  170 QRS Duration: 86 QT Interval:  352 QTC Calculation: 432 R Axis:   8 Text Interpretation: Normal sinus rhythm with sinus arrhythmia Confirmed by Candace Begue ([id number]) on 09/26/2019 3:38:58 AM   Radiology DG Chest Portable 1 View  Result Date: 09/26/2019 CLINICAL DATA:  Palpitations EXAM: PORTABLE CHEST 1 VIEW COMPARISON:  None. FINDINGS: The heart size and mediastinal contours are within normal limits. Both lungs are clear. The visualized skeletal structures are unremarkable. IMPRESSION: No active disease. Electronically Signed   By: Jasmine Pang M.D.   On: 09/26/2019 03:57    Procedures Procedures (including critical care time)  Medications Ordered in ED Medications - No data to display  ED Course  I have reviewed the triage vital signs and the nursing notes.  Pertinent labs & imaging results that were available during my care of the patient were  reviewed by me and considered in my medical decision making (see chart for details).    PERC negative wells 0, highly doubt PE in this low risk patient.  Symptoms are ongoing though the patient is not tacycardiac in the ED.  I suspect this is anxiety.  Patient is stable for discharge with close follow up.    Megan Yang was evaluated in Emergency Department on 09/26/2019 for the symptoms described in the history of present illness. She was evaluated in the context of the global COVID-19 pandemic, which necessitated consideration that the patient might be at risk for infection with the SARS-CoV-2 virus that causes COVID-19. Institutional protocols and algorithms that pertain to the evaluation of patients at risk for COVID-19 are in a state of rapid  change based on information released by regulatory bodies including the CDC and federal and state organizations. These policies and algorithms were followed during the patient's care in the ED.  Final Clinical Impression(s) / ED Diagnoses Return for intractable cough, coughing up blood,fevers >100.4 unrelieved by medication, shortness of breath, intractable vomiting, chest pain, shortness of breath, weakness,numbness, changes in speech, facial asymmetry,abdominal pain, passing out,Inability to tolerate liquids or food, cough, altered mental status or any concerns. No signs of systemic illness or infection. The patient is nontoxic-appearing on exam and vital signs are within normal limits.   I have reviewed the triage vital signs and the nursing notes. Pertinent labs &imaging results that were available during my care of the patient were reviewed by me and considered in my medical decision making (see chart for details).After history, exam, and medical workup I feel the patient has beenappropriately medically screened and is safe for discharge home. Pertinent diagnoses were discussed with the patient. Patient was given return precautions.     Jeffren Dombek, MD 09/26/19 [phone]

## 2019-09-26 NOTE — ED Triage Notes (Signed)
Pt c/o palpitations " panic attack" x 1 hr, increased stress and out of xanax and latuda

## 2019-12-14 ENCOUNTER — Emergency Department (HOSPITAL_BASED_OUTPATIENT_CLINIC_OR_DEPARTMENT_OTHER)
Admission: EM | Admit: 2019-12-14 | Discharge: 2019-12-14 | Disposition: A | Discharge status: Home / Self Care | Payer: Medicaid Other | Attending: Emergency Medicine | Admitting: Emergency Medicine

## 2019-12-14 ENCOUNTER — Emergency Department (HOSPITAL_BASED_OUTPATIENT_CLINIC_OR_DEPARTMENT_OTHER): Payer: Medicaid Other

## 2019-12-14 ENCOUNTER — Other Ambulatory Visit: Payer: Self-pay

## 2019-12-14 ENCOUNTER — Encounter (HOSPITAL_BASED_OUTPATIENT_CLINIC_OR_DEPARTMENT_OTHER): Payer: Self-pay | Admitting: *Deleted

## 2019-12-14 DIAGNOSIS — W010XXA Fall on same level from slipping, tripping and stumbling without subsequent striking against object, initial encounter: Secondary | ICD-10-CM | POA: Insufficient documentation

## 2019-12-14 DIAGNOSIS — S99922A Unspecified injury of left foot, initial encounter: Secondary | ICD-10-CM | POA: Diagnosis present

## 2019-12-14 DIAGNOSIS — S92302A Fracture of unspecified metatarsal bone(s), left foot, initial encounter for closed fracture: Secondary | ICD-10-CM | POA: Insufficient documentation

## 2019-12-14 DIAGNOSIS — S92902A Unspecified fracture of left foot, initial encounter for closed fracture: Secondary | ICD-10-CM

## 2019-12-14 MED ORDER — OXYCODONE-ACETAMINOPHEN 5-325 MG PO TABS: 1.0000 | ORAL_TABLET | Freq: Four times a day (QID) | ORAL | 0 refills | Status: DC | PRN

## 2019-12-14 MED ORDER — IBUPROFEN 800 MG PO TABS: 800.0000 mg | ORAL_TABLET | Freq: Once | ORAL | Status: AC

## 2019-12-14 MED ORDER — OXYCODONE-ACETAMINOPHEN 5-325 MG PO TABS: 1.0000 | ORAL_TABLET | Freq: Four times a day (QID) | ORAL | 0 refills | Status: AC | PRN

## 2019-12-14 MED ORDER — IBUPROFEN 800 MG PO TABS
ORAL_TABLET | ORAL | Status: AC
  Administered 2019-12-14: 800 mg via ORAL
  Filled 2019-12-14: qty 1

## 2019-12-14 MED ORDER — IBUPROFEN 400 MG PO TABS: 400.0000 mg | ORAL_TABLET | Freq: Once | ORAL | Status: DC

## 2019-12-14 NOTE — ED Notes (Signed)
Graham crackers and ginger ale provided to pt

## 2019-12-14 NOTE — Discharge Instructions (Addendum)
At this time there does not appear to be the presence of an emergent medical condition, however there is always the potential for conditions to change. Please read and follow the below instructions.  Please return to the Emergency Department immediately for any new or worsening symptoms. Please be sure to follow up with your Primary Care Provider within one week regarding your visit today; please call their office to schedule an appointment even if you are feeling better for a follow-up visit. Call Dr. Ranell Patrick' office tomorrow morning to schedule a follow-up appointment for further evaluation and treatment of your left foot injury. You will likely need surgery for definitive repair of your foot so is important to closely follow-up with their office Please keep your foot elevated above the level of your heart as often as possible to help with pain and swelling. Ice and rest will also be helpful. Keep weight off of your foot to avoid further injury, use the crutches given to you today. You may use the pain medication Percocet (Oxycodone/Acetaminophen) as prescribed for severe pain.  Percocet will make you drowsy so do not drive, drink alcohol, take other sedating medications or perform any dangerous activities such as driving after taking Percocet.  Percocet contains Tylenol (acetaminophen) so do not take any other medications containing Tylenol with Percocet. You may use over-the-counter anti-inflammatory such as ibuprofen as directed on the packaging to help with your pain  Go to the nearest Emergency Department immediately if: You have fever or chills Your foot becomes very painful or numb. Your toes become very pale or turn blue. You have any new/concerning or worsening of symptoms    Please read the additional information packets attached to your discharge summary.  Do not take your medicine if  develop an itchy rash, swelling in your mouth or lips, or difficulty breathing; call 911 and seek  immediate emergency medical attention if this occurs.  You may review your lab tests and imaging results in their entirety on your MyChart account.  Please discuss all results of fully with your primary care provider and other specialist at your follow-up visit.  Note: Portions of this text may have been transcribed using voice recognition software. Every effort was made to ensure accuracy; however, inadvertent computerized transcription errors may still be present.

## 2019-12-14 NOTE — ED Notes (Signed)
Patient transported to CT 

## 2019-12-14 NOTE — ED Triage Notes (Signed)
C/o left foot injury x 2 hrs ago

## 2019-12-14 NOTE — ED Notes (Signed)
Pt to CT

## 2019-12-14 NOTE — ED Provider Notes (Signed)
[facility] EMERGENCY DEPARTMENT Provider Note   CSN: [id number] Arrival date & time: 12/14/19  1328     History Chief Complaint  Patient presents with  . Foot Injury    Megan Yang is a 34 y.o. female history of bipolar, sciatica  Patient presents today for left foot pain onset around 2 hours prior to arrival she was cleaning up a spill off the ground. She had a rag underneath her foot and was wiping it up using her foot when she slipped falling directly onto her left foot she reports immediate onset moderate-severe in intensity throbbing pain constant nonradiating worse with palpation ambulation improved with rest. She drove herself to the hospital today. Associated with some tingling sensation to her left toes and swelling of the foot.  Denies head injury, loss conscious, blood thinner use, headache, neck pain, back pain, chest pain, abdominal pain, weakness or any additional concerns.  HPI     Past Medical History:  Diagnosis Date  . Anxiety   . Bipolar 1 disorder (HCC)   . Depression   . Sciatica     There are no problems to display for this patient.   History reviewed. No pertinent surgical history.   OB History   No obstetric history on file.     No family history on file.  Social History   Tobacco Use  . Smoking status: Never Smoker  . Smokeless tobacco: Never Used  Vaping Use  . Vaping Use: Never used  Substance Use Topics  . Alcohol use: Yes    Comment: occ  . Drug use: Never    Home Medications Prior to Admission medications   Medication Sig Start Date End Date Taking? Authorizing Provider  cephALEXin (KEFLEX) 500 MG capsule Take 1 capsule (500 mg total) by mouth 2 (two) times daily. 01/31/19   Rancour, Jeannett Senior, MD  doxycycline (VIBRAMYCIN) 100 MG capsule Take 1 capsule (100 mg total) by mouth 2 (two) times daily. 07/13/19   Gwyneth Sprout, MD  ibuprofen (ADVIL) 800 MG tablet Take 800 mg by mouth every 6 (six) hours as needed for  moderate pain.    [provider]  oxyCODONE-acetaminophen (PERCOCET/ROXICET) 5-325 MG tablet Take 1 tablet by mouth every 6 (six) hours as needed for severe pain. 12/14/19   Harlene Salts A, PA-C    Allergies    Flagyl [metronidazole] and Ivp dye [iodinated diagnostic agents]  Review of Systems   Review of Systems Ten systems are reviewed and are negative for acute change except as noted in the HPI  Physical Exam Updated Vital Signs BP (!) 135/92 (BP Location: Right Arm)   Pulse 86   Temp 98.5 F (36.9 C) (Oral)   Resp 20   Ht 5\' 5"  (1.651 m)   Wt 102.1 kg   LMP 11/21/2019   SpO2 98%   BMI 37.44 kg/m   Physical Exam Constitutional:      General: She is not in acute distress.    Appearance: Normal appearance. She is well-developed. She is not ill-appearing or diaphoretic.  HENT:     Head: Normocephalic and atraumatic.  Eyes:     General: Vision grossly intact. Gaze aligned appropriately.     Pupils: Pupils are equal, round, and reactive to light.  Neck:     Trachea: Trachea and phonation normal.  Cardiovascular:     Pulses:          Dorsalis pedis pulses are 2+ on the right side and 2+ on the left  side.  Pulmonary:     Effort: Pulmonary effort is normal. No respiratory distress.  Abdominal:     General: There is no distension.     Palpations: Abdomen is soft.     Tenderness: There is no abdominal tenderness. There is no guarding or rebound.  Musculoskeletal:        General: Normal range of motion.     Cervical back: Normal range of motion.       Feet:  Feet:     Right foot:     Protective Sensation: 5 sites tested. 5 sites sensed.     Skin integrity: Skin integrity normal.     Left foot:     Protective Sensation: 5 sites tested. 5 sites sensed.     Skin integrity: Skin integrity normal.     Comments: Tenderness along the dorsal midfoot without skin break. Moderate swelling present. Capillary refill and sensation intact to all toes. Pedal pulses  intact and equal bilaterally. No bruising to the sole of the foot. Achilles tendon intact. Pain with motion at the toes. No pain with movement at the ankle knee or hip. Compartments soft. Skin:    General: Skin is warm and dry.  Neurological:     Mental Status: She is alert.     GCS: GCS eye subscore is 4. GCS verbal subscore is 5. GCS motor subscore is 6.     Comments: Speech is clear and goal oriented, follows commands Major Cranial nerves without deficit, no facial droop Moves extremities without ataxia, coordination intact  Psychiatric:        Behavior: Behavior normal.     ED Results / Procedures / Treatments   Labs (all labs ordered are listed, but only abnormal results are displayed) Labs Reviewed - No data to display  EKG None  Radiology CT Foot Left Wo Contrast  Result Date: 12/14/2019 CLINICAL DATA:  Injury, fracture EXAM: CT OF THE LEFT FOOT WITHOUT CONTRAST TECHNIQUE: Multidetector CT imaging of the left foot was performed according to the standard protocol. Multiplanar CT image reconstructions were also generated. COMPARISON:  12/14/2019 FINDINGS: Bones/Joint/Cartilage There are comminuted intra-articular fractures of the base of the first, second, third, and fourth metatarsals. Mild distraction of the fracture fragments without dislocation at the tarsometatarsal joints. There is a small avulsion type fracture along the plantar medial aspect of the fifth metatarsal, minimally distracted. There is a small intra-articular avulsion fracture off the dorsal distal margin of the medial cuneiform, involving the first tarsometatarsal joint. There is a comminuted intra-articular fracture of the medial plantar aspect of the lateral cuneiform. There are essentially nondisplaced fractures in the axial plane involving the dorsal margin of the cuboid extending into the calcaneocuboid joint. A slightly comminuted axial fracture extends into the anterior margin of the calcaneus at the  calcaneocuboid joint, terminating just before the sinus tarsi. There are no acute abnormalities of the forefoot or ankle. Ligaments Suboptimally assessed by CT. Muscles and Tendons Intact. Soft tissues There is moderate subcutaneous edema throughout the forefoot. There is soft tissue attenuation plantar aspect of the midfoot, deep to the flexor digitorum musculature, consistent with hematoma. This area abuts the numerous tarsometatarsal fractures described above. IMPRESSION: 1. Comminuted intra-articular fractures at the base of the first through fourth metatarsals. No evidence of tarsometatarsal dislocation. 2. Small avulsion fracture medial aspect base of the fifth metatarsal. 3. Intra-articular fracture dorsal distal aspect medial cuneiform, involving the first tarsometatarsal joint. 4. Comminuted intra-articular lateral cuneiform fracture. 5. Nondisplaced fractures in the  axial plane involving the cuboid and calcaneus surrounding the calcaneocuboid joint. 6. Hematoma within the plantar aspect of the midfoot surrounding the tarsal metatarsal fractures. Electronically Signed   By: Sharlet SalinaMichael  Brown M.D.   On: 12/14/2019 19:57   DG Foot Complete Left  Result Date: 12/14/2019 CLINICAL DATA:  Injury EXAM: LEFT FOOT - COMPLETE 3+ VIEW COMPARISON:  None. FINDINGS: There are suspected acute fractures at bases of second and third metatarsals. There also may be involvement of base of the fourth metatarsal. Lisfranc injury is not excluded. Soft tissue swelling is present. IMPRESSION: Acute fractures are suspected at the bases of the second, third, and possibly fourth metatarsals. Lisfranc injury is not excluded Electronically Signed   By: Guadlupe SpanishPraneil  Patel M.D.   On: 12/14/2019 14:30    Procedures Procedures (including critical care time)  Medications Ordered in ED Medications  ibuprofen (ADVIL) tablet 800 mg (800 mg Oral Given 12/14/19 1557)    ED Course  I have reviewed the triage vital signs and the nursing  notes.  Pertinent labs & imaging results that were available during my care of the patient were reviewed by me and considered in my medical decision making (see chart for details).    MDM Rules/Calculators/A&P                         Additional history obtained from: 1. Nursing notes from this visit. 2. Review of electronic medical records. ----- 34 year old female presents today for left foot pain and swelling after she slipped and fell directly onto the foot. She is neurovascularly intact upon initial evaluation. She has moderate swelling to the dorsum of the foot but no skin break. Minimal pain with motion at the ankle no pain with motion at the knee or hip. Compartments are soft. X-ray obtained in triage reviewed below.  DG Left Foot:  IMPRESSION:  Acute fractures are suspected at the bases of the second, third, and  possibly fourth metatarsals. Lisfranc injury is not excluded   Consulted with Dr. Devonne DoughtyNoris on-call orthopedic who advises CT noncontrast of the left foot. And then for patient be placed in posterior splint plus stirrups, remain nonweightbearing and follow-up with his office. - CT Left Foot:  IMPRESSION:  1. Comminuted intra-articular fractures at the base of the first  through fourth metatarsals. No evidence of tarsometatarsal  dislocation.  2. Small avulsion fracture medial aspect base of the fifth  metatarsal.  3. Intra-articular fracture dorsal distal aspect medial cuneiform,  involving the first tarsometatarsal joint.  4. Comminuted intra-articular lateral cuneiform fracture.  5. Nondisplaced fractures in the axial plane involving the cuboid  and calcaneus surrounding the calcaneocuboid joint.  6. Hematoma within the plantar aspect of the midfoot surrounding the  tarsal metatarsal fractures.  - Patient updated on findings above, she was placed in a posterior splint plus stirrups by nursing staff. I evaluated the patient after splint application she reports it is  comfortable she is neurovascular intact to all toes. Patient was given crutches advised to remain nonweightbearing. 6 (six) pills Percocet prescribed for acute pain secondary to fractures. Discussed narcotic precautions with patient and she states understanding. Patient encouraged to call Dr. Ranell PatrickNorris' office tomorrow morning to schedule a follow-up appointment. Patient is driving home today from the ER which is why narcotic medication was not given during visit.  At this time there does not appear to be any evidence of an acute emergency medical condition and the patient appears stable for discharge  with appropriate outpatient follow up. Diagnosis was discussed with patient who verbalizes understanding of care plan and is agreeable to discharge. I have discussed return precautions with patient who verbalizes understanding. Patient encouraged to follow-up with their PCP and Orthopedist. All questions answered.  Patient's case discussed with Dr. Jacqulyn Bath who agrees with plan to discharge with follow-up.   Note: Portions of this report may have been transcribed using voice recognition software. Every effort was made to ensure accuracy; however, inadvertent computerized transcription errors may still be present. Final Clinical Impression(s) / ED Diagnoses Final diagnoses:  Multiple closed fractures of left foot, initial encounter    Rx / DC Orders ED Discharge Orders         Ordered    oxyCODONE-acetaminophen (PERCOCET/ROXICET) 5-325 MG tablet  Every 6 hours PRN        12/14/19 2203           Elizabeth Palau 12/14/19 2205    Maia Plan, MD 12/15/19 1135

## 2020-09-10 ENCOUNTER — Ambulatory Visit: Payer: Medicaid Other | Admitting: Family Medicine

## 2020-09-10 ENCOUNTER — Encounter: Payer: Medicaid Other | Admitting: Family Medicine

## 2020-09-19 ENCOUNTER — Ambulatory Visit: Payer: Medicaid Other | Admitting: Family Medicine

## 2022-06-19 IMAGING — CR DG FOOT COMPLETE 3+V*L*
3 series · 3 of 3 positions shown · non-contrast
Comparison: None.

CLINICAL DATA: Injury

EXAM:
LEFT FOOT - COMPLETE 3+ VIEW

[t foot ap left]
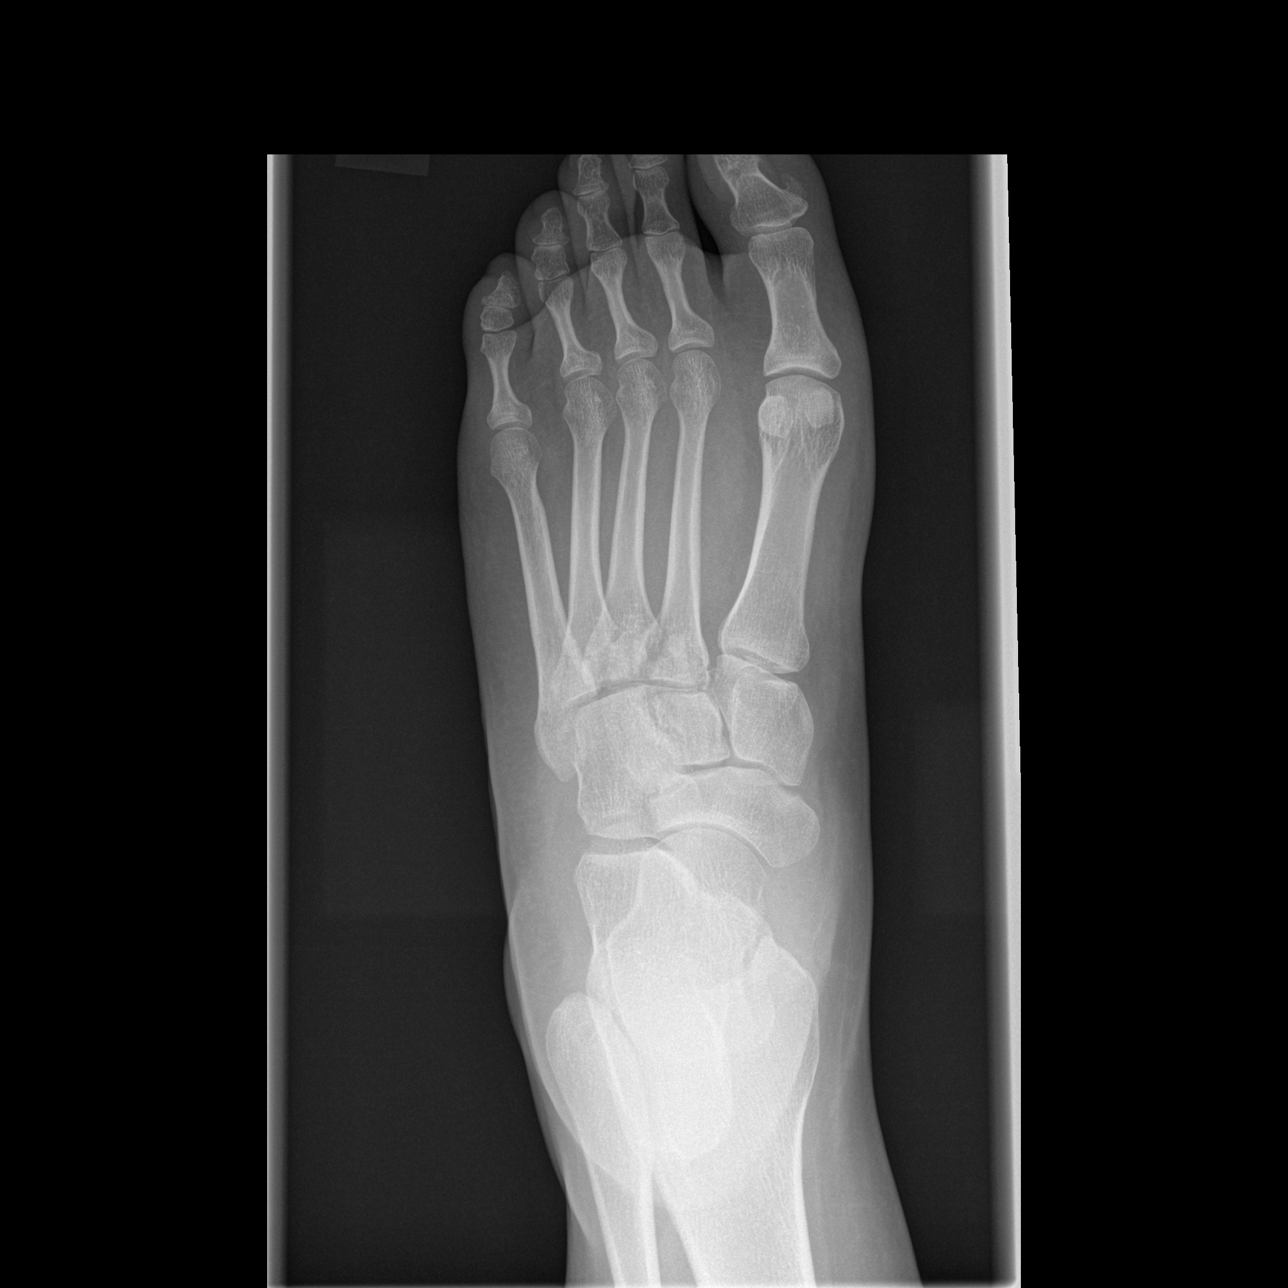

[t foot oblique left]
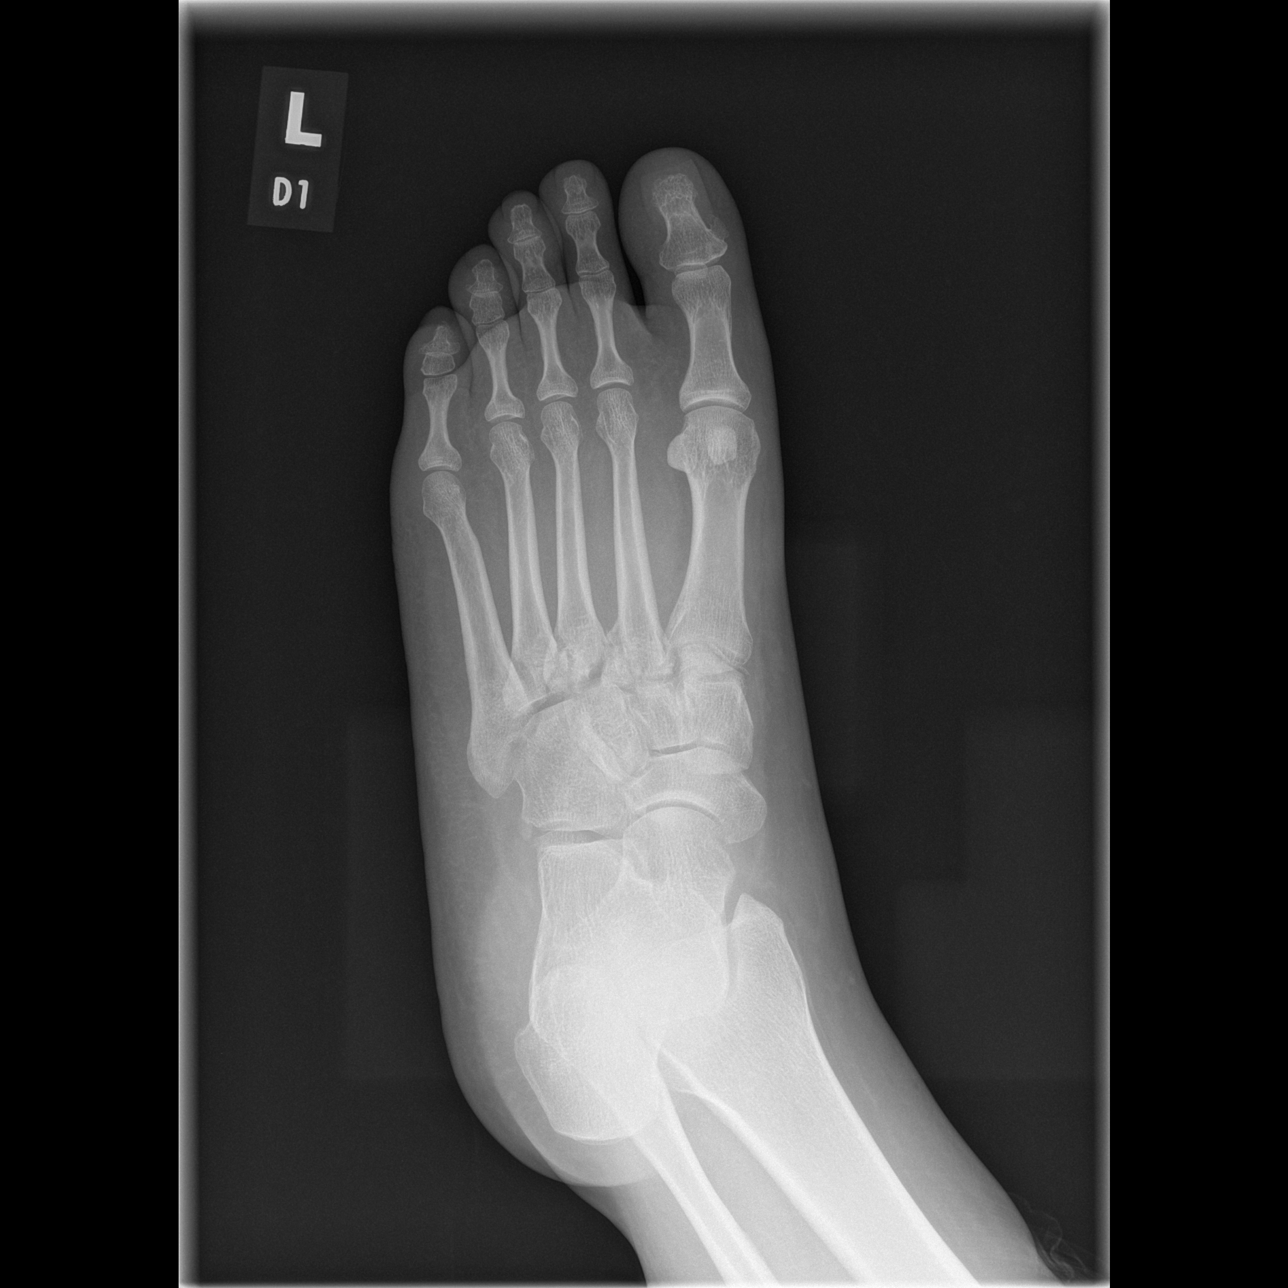

[t foot lat left]
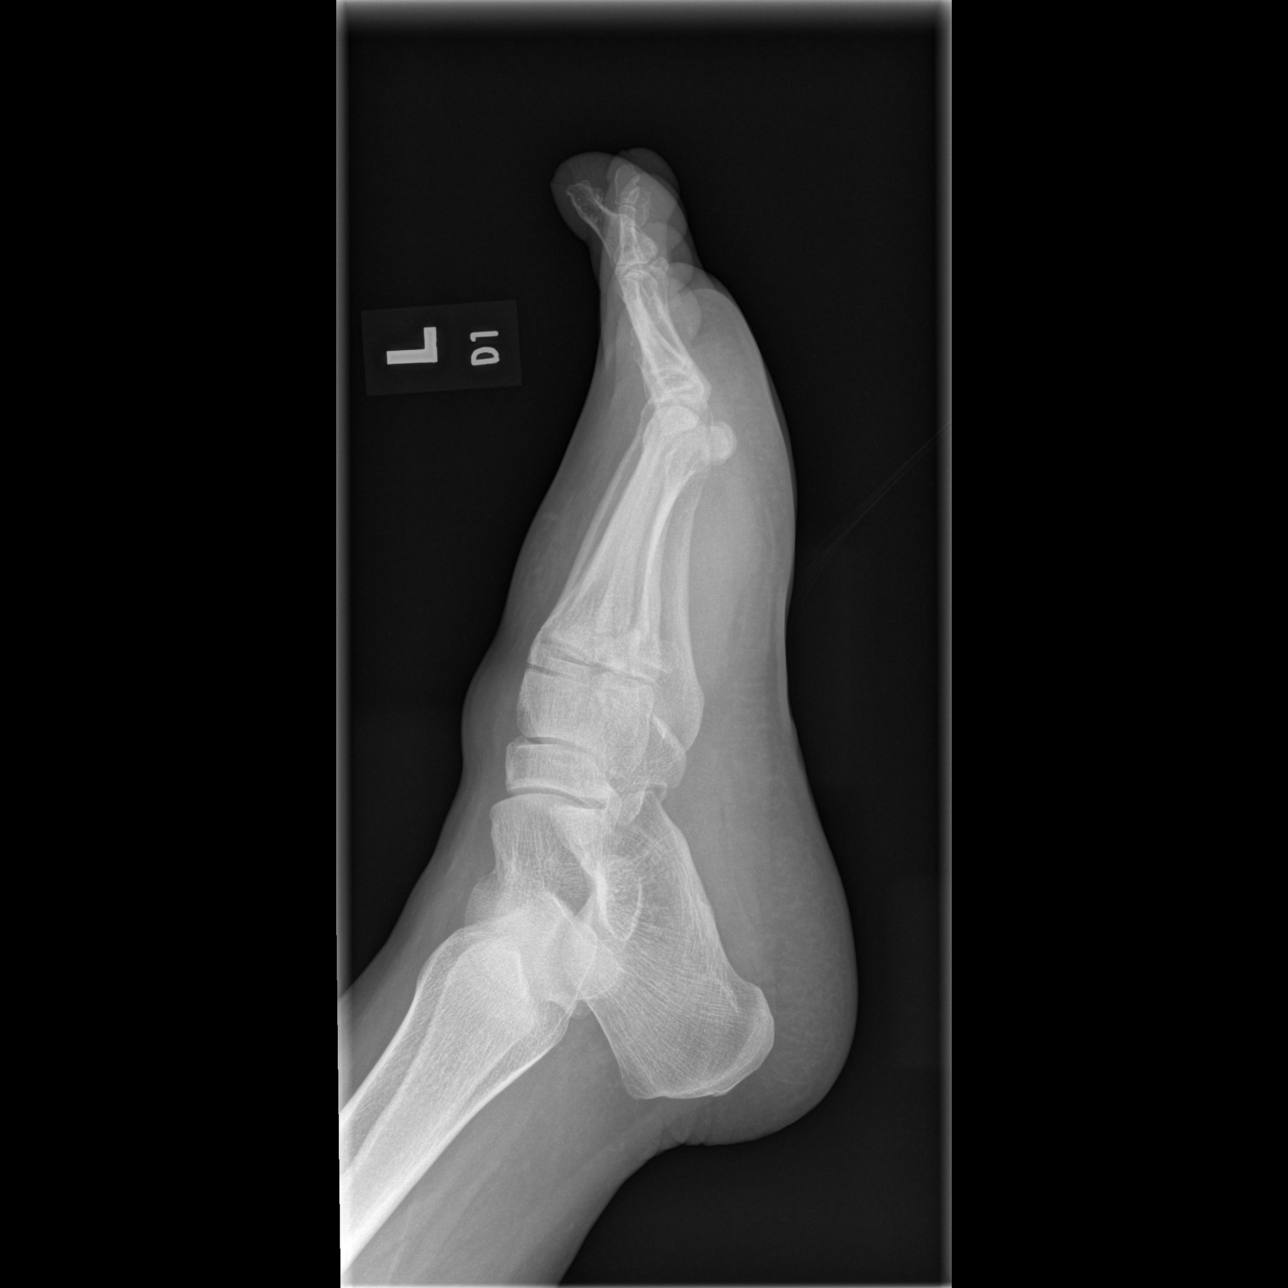

[3 of 3 positions shown; findings below may reference images not displayed]

FINDINGS: There are suspected acute fractures at bases of second and third
metatarsals. There also may be involvement of base of the fourth
metatarsal. Lisfranc injury is not excluded. Soft tissue swelling is
present.
IMPRESSION: Acute fractures are suspected at the bases of the second, third, and
possibly fourth metatarsals. Lisfranc injury is not excluded

## 2023-01-27 ENCOUNTER — Encounter (HOSPITAL_BASED_OUTPATIENT_CLINIC_OR_DEPARTMENT_OTHER): Payer: Self-pay

## 2023-01-27 ENCOUNTER — Emergency Department (HOSPITAL_BASED_OUTPATIENT_CLINIC_OR_DEPARTMENT_OTHER)
Admission: EM | Admit: 2023-01-27 | Discharge: 2023-01-27 | Disposition: A | Discharge status: Home / Self Care | Payer: Medicaid Other | Attending: Emergency Medicine | Admitting: Emergency Medicine

## 2023-01-27 ENCOUNTER — Other Ambulatory Visit: Payer: Self-pay

## 2023-01-27 DIAGNOSIS — G43009 Migraine without aura, not intractable, without status migrainosus: Secondary | ICD-10-CM | POA: Insufficient documentation

## 2023-01-27 DIAGNOSIS — R519 Headache, unspecified: Secondary | ICD-10-CM | POA: Diagnosis present

## 2023-01-27 MED ORDER — PROCHLORPERAZINE EDISYLATE 10 MG/2ML IJ SOLN
10.0000 mg | Freq: Once | INTRAMUSCULAR | Status: AC
  Administered 2023-01-27: 10 mg via INTRAVENOUS
  Filled 2023-01-27: qty 2

## 2023-01-27 MED ORDER — KETOROLAC TROMETHAMINE 30 MG/ML IJ SOLN
15.0000 mg | Freq: Once | INTRAMUSCULAR | Status: AC
  Administered 2023-01-27: 15 mg via INTRAVENOUS
  Filled 2023-01-27: qty 1

## 2023-01-27 NOTE — ED Provider Notes (Signed)
[place] [facility] AT [facility]  Provider Note  CSN: [id number] Arrival date & time: 01/27/23 1027  History Chief Complaint  Patient presents with   Headache    Megan Yang is a 38 y.o. female with history of migraine headaches reports 3 days of typical R sided headache, associated with nausea. Not much photophobia. No fevers or other recent illness. Reported some palpitations in triage.    Home Medications Prior to Admission medications   Medication Sig Start Date End Date Taking? Authorizing Provider  cephALEXin (KEFLEX) 500 MG capsule Take 1 capsule (500 mg total) by mouth 2 (two) times daily. 01/31/19   Rancour, Jeannett Senior, MD  doxycycline (VIBRAMYCIN) 100 MG capsule Take 1 capsule (100 mg total) by mouth 2 (two) times daily. 07/13/19   Gwyneth Sprout, MD  ibuprofen (ADVIL) 800 MG tablet Take 800 mg by mouth every 6 (six) hours as needed for moderate pain.    [provider]  oxyCODONE-acetaminophen (PERCOCET/ROXICET) 5-325 MG tablet Take 1 tablet by mouth every 6 (six) hours as needed for severe pain. 12/14/19   Harlene Salts A, PA-C     Allergies    Flagyl [metronidazole] and Ivp dye [iodinated contrast media]   Review of Systems   Review of Systems Please see HPI for pertinent positives and negatives  Physical Exam BP (!) 152/84 (BP Location: Right Arm)   Pulse 88   Temp 97.7 F (36.5 C) (Oral)   Resp 18   Ht 5\' 5"  (1.651 m)   Wt 112.9 kg   LMP 01/20/2023 (Exact Date)   SpO2 100%   BMI 41.44 kg/m   Physical Exam Vitals and nursing note reviewed.  Constitutional:      Appearance: Normal appearance.  HENT:     Head: Normocephalic and atraumatic.     Nose: Nose normal.     Mouth/Throat:     Mouth: Mucous membranes are moist.  Eyes:     Extraocular Movements: Extraocular movements intact.     Conjunctiva/sclera: Conjunctivae normal.  Cardiovascular:     Rate and Rhythm: Normal rate.  Pulmonary:     Effort: Pulmonary  effort is normal.     Breath sounds: Normal breath sounds.  Abdominal:     General: Abdomen is flat.     Palpations: Abdomen is soft.     Tenderness: There is no abdominal tenderness.  Musculoskeletal:        General: No swelling. Normal range of motion.     Cervical back: Neck supple.  Skin:    General: Skin is warm and dry.  Neurological:     General: No focal deficit present.     Mental Status: She is alert and oriented to person, place, and time.     Cranial Nerves: No cranial nerve deficit.     Sensory: No sensory deficit.     Motor: No weakness.  Psychiatric:        Mood and Affect: Mood normal.     ED Results / Procedures / Treatments   EKG None  Procedures Procedures  Medications Ordered in the ED Medications  ketorolac (TORADOL) 30 MG/ML injection 15 mg (15 mg Intravenous Given 01/27/23 0405)  prochlorperazine (COMPAZINE) injection 10 mg (10 mg Intravenous Given 01/27/23 0405)    Initial Impression and Plan  Patient here with migrainous headache, exam is reassuring. Will give headache cocktail and re-evaluate.   ED Course   Clinical Course as of 01/27/23 0514  Thu Jan 27, 2023  0512  Patient's headache is resolved. She is ready go to home. Recommend she follow up with PCP for long term management. RTED for any other concerns.  [CS]    Clinical Course User Index [CS] Pollyann Savoy, MD     MDM Rules/Calculators/A&P Medical Decision Making Problems Addressed: Migraine without aura and without status migrainosus, not intractable: acute illness or injury  Risk Prescription drug management.     Final Clinical Impression(s) / ED Diagnoses Final diagnoses:  Migraine without aura and without status migrainosus, not intractable    Rx / DC Orders ED Discharge Orders     None        Pollyann Savoy, MD 01/27/23 [phone]

## 2023-01-27 NOTE — ED Triage Notes (Addendum)
Pt with HA x3 days that are getting worse. Mainly on the RT side. Pt states it is now starting on LT side. Pt also very nauseous. Hx of migraines. Pt denies photophobia at this time, but it is starting to begin. She feels like her heart is racing; reports she has anxiety and wants to make sure she is okay.

## 2023-04-15 ENCOUNTER — Emergency Department (HOSPITAL_BASED_OUTPATIENT_CLINIC_OR_DEPARTMENT_OTHER)

## 2023-04-15 ENCOUNTER — Emergency Department (HOSPITAL_BASED_OUTPATIENT_CLINIC_OR_DEPARTMENT_OTHER)
Admission: EM | Admit: 2023-04-15 | Discharge: 2023-04-15 | Disposition: A | Discharge status: Home / Self Care | Attending: Emergency Medicine | Admitting: Emergency Medicine

## 2023-04-15 ENCOUNTER — Encounter (HOSPITAL_BASED_OUTPATIENT_CLINIC_OR_DEPARTMENT_OTHER): Payer: Self-pay | Admitting: Urology

## 2023-04-15 DIAGNOSIS — R1013 Epigastric pain: Secondary | ICD-10-CM | POA: Diagnosis present

## 2023-04-15 DIAGNOSIS — R7401 Elevation of levels of liver transaminase levels: Secondary | ICD-10-CM | POA: Insufficient documentation

## 2023-04-15 DIAGNOSIS — R748 Abnormal levels of other serum enzymes: Secondary | ICD-10-CM

## 2023-04-15 DIAGNOSIS — K5900 Constipation, unspecified: Secondary | ICD-10-CM | POA: Diagnosis not present

## 2023-04-15 DIAGNOSIS — R112 Nausea with vomiting, unspecified: Secondary | ICD-10-CM | POA: Insufficient documentation

## 2023-04-15 LAB — COMPREHENSIVE METABOLIC PANEL WITH GFR
ALT: 209 U/L — ABNORMAL HIGH (ref 0–44)
AST: 452 U/L — ABNORMAL HIGH (ref 15–41)
Albumin: 4 g/dL (ref 3.5–5.0)
Alkaline Phosphatase: 78 U/L (ref 38–126)
Anion gap: 11 (ref 5–15)
BUN: 6 mg/dL (ref 6–20)
CO2: 20 mmol/L — ABNORMAL LOW (ref 22–32)
Calcium: 8.9 mg/dL (ref 8.9–10.3)
Chloride: 105 mmol/L (ref 98–111)
Creatinine, Ser: 0.86 mg/dL (ref 0.44–1.00)
GFR, Estimated: >60 mL/min (ref >60)
Glucose, Bld: 135 mg/dL — ABNORMAL HIGH (ref 70–99)
Potassium: 3.7 mmol/L (ref 3.5–5.1)
Sodium: 136 mmol/L (ref 135–145)
Total Bilirubin: 1.6 mg/dL — ABNORMAL HIGH (ref 0.0–1.2)
Total Protein: 7.5 g/dL (ref 6.5–8.1)

## 2023-04-15 LAB — URINALYSIS, ROUTINE W REFLEX MICROSCOPIC
Glucose, UA: NEGATIVE mg/dL
Ketones, ur: 40 mg/dL — AB
Leukocytes,Ua: NEGATIVE
Nitrite: NEGATIVE
Protein, ur: 30 mg/dL — AB
Specific Gravity, Urine: >=1.03 (ref 1.005–1.030)
pH: 6 (ref 5.0–8.0)

## 2023-04-15 LAB — PREGNANCY, URINE: Preg Test, Ur: NEGATIVE

## 2023-04-15 LAB — CBC
HCT: 33.8 % — ABNORMAL LOW (ref 36.0–46.0)
Hemoglobin: 11.6 g/dL — ABNORMAL LOW (ref 12.0–15.0)
MCH: 28.1 pg (ref 26.0–34.0)
MCHC: 34.3 g/dL (ref 30.0–36.0)
MCV: 81.8 fL (ref 80.0–100.0)
Platelets: 292 10*3/uL (ref 150–400)
RBC: 4.13 MIL/uL (ref 3.87–5.11)
RDW: 13.8 % (ref 11.5–15.5)
WBC: 5.8 10*3/uL (ref 4.0–10.5)
nRBC: 0 % (ref 0.0–0.2)

## 2023-04-15 LAB — URINALYSIS, MICROSCOPIC (REFLEX): RBC / HPF: >50 RBC/hpf (ref 0–5)

## 2023-04-15 LAB — LIPASE, BLOOD: Lipase: 35 U/L (ref 11–51)

## 2023-04-15 MED ORDER — PANTOPRAZOLE SODIUM 40 MG IV SOLR
40.0000 mg | Freq: Once | INTRAVENOUS | Status: AC
  Administered 2023-04-15: 40 mg via INTRAVENOUS
  Filled 2023-04-15: qty 10

## 2023-04-15 MED ORDER — HYDROMORPHONE HCL 1 MG/ML IJ SOLN
0.5000 mg | Freq: Once | INTRAMUSCULAR | Status: AC
  Administered 2023-04-15: 0.5 mg via INTRAVENOUS
  Filled 2023-04-15: qty 1

## 2023-04-15 MED ORDER — METOCLOPRAMIDE HCL 10 MG PO TABS: 10.0000 mg | ORAL_TABLET | Freq: Four times a day (QID) | ORAL | 0 refills | Status: AC

## 2023-04-15 MED ORDER — OMEPRAZOLE 20 MG PO CPDR: 20.0000 mg | DELAYED_RELEASE_CAPSULE | Freq: Every day | ORAL | 0 refills | Status: AC

## 2023-04-15 MED ORDER — METOCLOPRAMIDE HCL 5 MG/ML IJ SOLN
10.0000 mg | Freq: Once | INTRAMUSCULAR | Status: AC
Start: 2023-04-15 — End: 2023-04-15
  Administered 2023-04-15: 10 mg via INTRAVENOUS
  Filled 2023-04-15: qty 2

## 2023-04-15 NOTE — ED Triage Notes (Signed)
 Pt states constipation and states having abdominal pain  States N/V that started last night  Denies fever but states chills and sweats  Took zofran Pta with no relief

## 2023-04-15 NOTE — Discharge Instructions (Addendum)
 Use Zofran for nausea and Reglan if this fails to control vomiting.  Recommend Miralax for constipation - up to 3 times daily until your bowel clear. Do this for a  maximum of 3 consecutive days.  Follow up with your doctor as we discussed for repeat testing of your elevated liver function tests tonight.

## 2023-04-15 NOTE — ED Provider Notes (Signed)
  EMERGENCY DEPARTMENT AT [facility] Provider Note   CSN: [id number] Arrival date & time: 04/15/23  1555     History  Chief Complaint  Patient presents with   Emesis   Constipation    Megan Yang is a 38 y.o. female.  Patient to ED with epigastric abdominal pain that started last night and associated with nonbloody emesis. History of similar symptoms in the past, but this is the worst. No fever. She reports she has been unable to have a bowel movement for several days. She tried an enema this morning and has a very small, soft BM. No history of abdominal surgeries. No chest pain, SOB.   The history is provided by the patient. No language interpreter was used.  Emesis Constipation Associated symptoms: vomiting        Home Medications Prior to Admission medications   Medication Sig Start Date End Date Taking? Authorizing Provider  metoCLOPramide (REGLAN) 10 MG tablet Take 1 tablet (10 mg total) by mouth every 6 (six) hours. 04/15/23  Yes Elpidio Anis, PA-C  omeprazole (PRILOSEC) 20 MG capsule Take 1 capsule (20 mg total) by mouth daily. 04/15/23  Yes Julyan Gales, PA-C  cephALEXin (KEFLEX) 500 MG capsule Take 1 capsule (500 mg total) by mouth 2 (two) times daily. 01/31/19   Rancour, Jeannett Senior, MD  doxycycline (VIBRAMYCIN) 100 MG capsule Take 1 capsule (100 mg total) by mouth 2 (two) times daily. 07/13/19   Gwyneth Sprout, MD  ibuprofen (ADVIL) 800 MG tablet Take 800 mg by mouth every 6 (six) hours as needed for moderate pain.    [provider]  oxyCODONE-acetaminophen (PERCOCET/ROXICET) 5-325 MG tablet Take 1 tablet by mouth every 6 (six) hours as needed for severe pain. 12/14/19   Harlene Salts A, PA-C      Allergies    Flagyl [metronidazole] and Ivp dye [iodinated contrast media]    Review of Systems   Review of Systems  Gastrointestinal:  Positive for constipation and vomiting.    Physical Exam Updated Vital Signs BP 99/67   Pulse 64    Temp (!) 97.3 F (36.3 C) (Oral)   Resp 15   Ht 5\' 5"  (1.651 m)   Wt 113 kg   LMP 04/12/2023   SpO2 99%   BMI 41.46 kg/m  Physical Exam Constitutional:      Appearance: She is well-developed. She is obese.  HENT:     Head: Normocephalic.  Cardiovascular:     Rate and Rhythm: Normal rate and regular rhythm.     Heart sounds: No murmur heard. Pulmonary:     Effort: Pulmonary effort is normal.     Breath sounds: Normal breath sounds. No wheezing, rhonchi or rales.  Abdominal:     Palpations: Abdomen is soft.     Tenderness: There is abdominal tenderness in the epigastric area. There is no guarding or rebound.    Musculoskeletal:        General: Normal range of motion.     Cervical back: Normal range of motion and neck supple.  Skin:    General: Skin is warm and dry.  Neurological:     General: No focal deficit present.     Mental Status: She is alert and oriented to person, place, and time.     ED Results / Procedures / Treatments   Labs (all labs ordered are listed, but only abnormal results are displayed) Labs Reviewed  COMPREHENSIVE METABOLIC PANEL WITH GFR - Abnormal; Notable for the following  components:      Result Value   CO2 20 (*)    Glucose, Bld 135 (*)    AST 452 (*)    ALT 209 (*)    Total Bilirubin 1.6 (*)    All other components within normal limits  CBC - Abnormal; Notable for the following components:   Hemoglobin 11.6 (*)    HCT 33.8 (*)    All other components within normal limits  URINALYSIS, ROUTINE W REFLEX MICROSCOPIC - Abnormal; Notable for the following components:   Hgb urine dipstick LARGE (*)    Bilirubin Urine SMALL (*)    Ketones, ur 40 (*)    Protein, ur 30 (*)    All other components within normal limits  URINALYSIS, MICROSCOPIC (REFLEX) - Abnormal; Notable for the following components:   Bacteria, UA MANY (*)    All other components within normal limits  LIPASE, BLOOD  PREGNANCY, URINE   Results for orders placed or  performed during the hospital encounter of 04/15/23  Lipase, blood   Collection Time: 04/15/23  4:07 PM  Result Value Ref Range   Lipase 35 11 - 51 U/L  Comprehensive metabolic panel   Collection Time: 04/15/23  4:07 PM  Result Value Ref Range   Sodium 136 135 - 145 mmol/L   Potassium 3.7 3.5 - 5.1 mmol/L   Chloride 105 98 - 111 mmol/L   CO2 20 (L) 22 - 32 mmol/L   Glucose, Bld 135 (H) 70 - 99 mg/dL   BUN 6 6 - 20 mg/dL   Creatinine, Ser 7.82 0.44 - 1.00 mg/dL   Calcium 8.9 8.9 - 95.6 mg/dL   Total Protein 7.5 6.5 - 8.1 g/dL   Albumin 4.0 3.5 - 5.0 g/dL   AST 213 (H) 15 - 41 U/L   ALT 209 (H) 0 - 44 U/L   Alkaline Phosphatase 78 38 - 126 U/L   Total Bilirubin 1.6 (H) 0.0 - 1.2 mg/dL   GFR, Estimated >08 >65 mL/min   Anion gap 11 5 - 15  CBC   Collection Time: 04/15/23  4:07 PM  Result Value Ref Range   WBC 5.8 4.0 - 10.5 K/uL   RBC 4.13 3.87 - 5.11 MIL/uL   Hemoglobin 11.6 (L) 12.0 - 15.0 g/dL   HCT 78.4 (L) 69.6 - 29.5 %   MCV 81.8 80.0 - 100.0 fL   MCH 28.1 26.0 - 34.0 pg   MCHC 34.3 30.0 - 36.0 g/dL   RDW 28.4 13.2 - 44.0 %   Platelets 292 150 - 400 K/uL   nRBC 0.0 0.0 - 0.2 %  Urinalysis, Routine w reflex microscopic -Urine, Clean Catch   Collection Time: 04/15/23  5:50 PM  Result Value Ref Range   Color, Urine YELLOW YELLOW   APPearance CLEAR CLEAR   Specific Gravity, Urine >=1.030 1.005 - 1.030   pH 6.0 5.0 - 8.0   Glucose, UA NEGATIVE NEGATIVE mg/dL   Hgb urine dipstick LARGE (A) NEGATIVE   Bilirubin Urine SMALL (A) NEGATIVE   Ketones, ur 40 (A) NEGATIVE mg/dL   Protein, ur 30 (A) NEGATIVE mg/dL   Nitrite NEGATIVE NEGATIVE   Leukocytes,Ua NEGATIVE NEGATIVE  Pregnancy, urine   Collection Time: 04/15/23  5:50 PM  Result Value Ref Range   Preg Test, Ur NEGATIVE NEGATIVE  Urinalysis, Microscopic (reflex)   Collection Time: 04/15/23  5:50 PM  Result Value Ref Range   RBC / HPF >50 0 - 5 RBC/hpf   WBC, UA  0-5 0 - 5 WBC/hpf   Bacteria, UA MANY (A) NONE SEEN    Squamous Epithelial / HPF 6-10 0 - 5 /HPF   Mucus PRESENT     EKG None  Radiology DG Abdomen Acute W/Chest Result Date: 04/15/2023 CLINICAL DATA:  Upper abdominal pain for 1 week EXAM: DG ABDOMEN ACUTE WITH 1 VIEW CHEST COMPARISON:  Ultrasound from earlier in the same day. FINDINGS: Cardiac shadow is within normal limits. The lungs are well aerated bilaterally. No focal infiltrate or effusion is noted. No acute bony abnormality is seen. Large and small bowel gas is noted. Minimal retained fecal material is noted within the right colon. No obstructive changes are seen. No free air is noted. The bony structures show evidence of an enthesophyte from the right iliac wing likely chronic in nature. IMPRESSION: No acute abnormality noted. Electronically Signed   By: Alcide Clever M.D.   On: 04/15/2023 19:45   US Abdomen Limited RUQ (LIVER/GB) Result Date: 04/15/2023 CLINICAL DATA:  Right upper quadrant. EXAM: ULTRASOUND ABDOMEN LIMITED RIGHT UPPER QUADRANT COMPARISON:  None Available. FINDINGS: Gallbladder: Gallstones are present. There is no gallbladder wall thickening or pericholecystic fluid. No sonographic Murphy sign noted by sonographer. Common bile duct: Diameter:  4.5 mm. Liver: No focal lesion identified. Within normal limits in parenchymal echogenicity. Portal vein is patent on color Doppler imaging with normal direction of blood flow towards the liver. Other: None. IMPRESSION: 1. Cholelithiasis. No additional sonographic evidence for acute cholecystitis. Electronically Signed   By: Darliss Cheney M.D.   On: 04/15/2023 19:33    Procedures Procedures    Medications Ordered in ED Medications  metoCLOPramide (REGLAN) injection 10 mg (10 mg Intravenous Given 04/15/23 1751)  pantoprazole (PROTONIX) injection 40 mg (40 mg Intravenous Given 04/15/23 1750)  HYDROmorphone (DILAUDID) injection 0.5 mg (0.5 mg Intravenous Given 04/15/23 1833)    ED Course/ Medical Decision Making/ A&P                                  Medical Decision Making This patient presents to the ED for concern of epigastric abdominal pain, this involves an extensive number of treatment options, and is a complaint that carries with it a high risk of complications and morbidity.  The differential diagnosis includes PUD, gastritis, pancreatitis, cholecystitis, perforation, obstruction   Co morbidities that complicate the patient evaluation  No significant medical history   Additional history obtained:  Additional history and/or information obtained from chart review, notable for n/a   Lab Tests:  I Ordered, and personally interpreted labs.  The pertinent results include:  UA witout evidence of infection, appears contaminated. Sp Gr 1.030, mild ketones and protein suggesting mild dehydration. No leukocytosis, normal hemoglobin; LFT's abnormal with AST 452, ALT 209, total bili 1.6.    Imaging Studies ordered:  I ordered imaging studies including Acute abd series and RUQ [place] Per radiologist interpretation: Acute abd series without abnormality. RUQ US showing gall stones without evidence cholecystitis.    Cardiac Monitoring:  The patient was maintained on a cardiac monitor.  I personally viewed and interpreted the cardiac monitored which showed an underlying rhythm of: n/a   Medicines ordered and prescription drug management:  I ordered medication including Reglan, Dilaudid  for nausea and pain Reevaluation of the patient after these medicines showed that the patient improved I have reviewed the patients home medicines and have made adjustments as needed   Test Considered:  N/a   Critical Interventions:  N/a   Consultations Obtained:  I requested consultation with the n/a,  and discussed lab and imaging findings as well as pertinent plan - they recommend: n/a   Problem List / ED Course:  Patient with abdominal pain and constipation complaints Pain improved with single dose medications here.   Elevated LFT's of questionable significance. Patient admits to regular alcohol use. No evidence cholecystitis Refer to PCP for further outpatient evaluation if pain continues and to have LFT's checked. She reports she has an already scheduled appointment in a week.    Reevaluation:  After the interventions noted above, I reevaluated the patient and found that they have :improved   Social Determinants of Health:  Regular alcohol use   Disposition:  After consideration of the diagnostic results and the patients response to treatment, I feel that the patient would benefit from discharge home. .   Amount and/or Complexity of Data Reviewed Labs: ordered. Radiology: ordered.  Risk Prescription drug management.           Final Clinical Impression(s) / ED Diagnoses Final diagnoses:  Epigastric pain  Nausea and vomiting, unspecified vomiting type  Elevated liver enzymes  Constipation, unspecified constipation type    Rx / DC Orders ED Discharge Orders          Ordered    omeprazole (PRILOSEC) 20 MG capsule  Daily        04/15/23 2002    metoCLOPramide (REGLAN) 10 MG tablet  Every 6 hours        04/15/23 2002              Elpidio Anis, PA-C 04/15/23 2218    Virgina Norfolk, DO 04/15/23 2304

## 2023-08-23 ENCOUNTER — Other Ambulatory Visit: Payer: Self-pay

## 2023-08-23 ENCOUNTER — Emergency Department (HOSPITAL_BASED_OUTPATIENT_CLINIC_OR_DEPARTMENT_OTHER)
Admission: EM | Admit: 2023-08-23 | Discharge: 2023-08-24 | Disposition: A | Discharge status: Home / Self Care | Attending: Emergency Medicine | Admitting: Emergency Medicine

## 2023-08-23 ENCOUNTER — Encounter (HOSPITAL_BASED_OUTPATIENT_CLINIC_OR_DEPARTMENT_OTHER): Payer: Self-pay | Admitting: *Deleted

## 2023-08-23 DIAGNOSIS — M5442 Lumbago with sciatica, left side: Secondary | ICD-10-CM | POA: Insufficient documentation

## 2023-08-23 DIAGNOSIS — M5441 Lumbago with sciatica, right side: Secondary | ICD-10-CM | POA: Insufficient documentation

## 2023-08-23 LAB — URINALYSIS, ROUTINE W REFLEX MICROSCOPIC
Bilirubin Urine: NEGATIVE
Glucose, UA: NEGATIVE mg/dL
Hgb urine dipstick: NEGATIVE
Ketones, ur: NEGATIVE mg/dL
Leukocytes,Ua: NEGATIVE
Nitrite: NEGATIVE
Protein, ur: NEGATIVE mg/dL
Specific Gravity, Urine: 1.015 (ref 1.005–1.030)
pH: 6.5 (ref 5.0–8.0)

## 2023-08-23 LAB — PREGNANCY, URINE: Preg Test, Ur: NEGATIVE

## 2023-08-23 MED ORDER — HYDROCODONE-ACETAMINOPHEN 5-325 MG PO TABS
1.0000 | ORAL_TABLET | Freq: Once | ORAL | Status: AC
  Administered 2023-08-23 (×2): 1 via ORAL
  Filled 2023-08-23: qty 1

## 2023-08-23 MED ORDER — DIAZEPAM 5 MG PO TABS
5.0000 mg | ORAL_TABLET | Freq: Once | ORAL | Status: AC
  Administered 2023-08-23 (×2): 5 mg via ORAL
  Filled 2023-08-23: qty 1

## 2023-08-23 MED ORDER — ACETAMINOPHEN 325 MG PO TABS
325.0000 mg | ORAL_TABLET | Freq: Once | ORAL | Status: AC
  Administered 2023-08-23 (×2): 325 mg via ORAL
  Filled 2023-08-23: qty 1

## 2023-08-23 MED ORDER — OXYCODONE HCL 5 MG PO TABS
5.0000 mg | ORAL_TABLET | Freq: Once | ORAL | Status: AC
  Administered 2023-08-23 (×2): 5 mg via ORAL
  Filled 2023-08-23: qty 1

## 2023-08-23 MED ORDER — KETOROLAC TROMETHAMINE 15 MG/ML IJ SOLN
15.0000 mg | Freq: Once | INTRAMUSCULAR | Status: AC
  Administered 2023-08-23 (×2): 15 mg via INTRAMUSCULAR
  Filled 2023-08-23: qty 1

## 2023-08-23 NOTE — ED Triage Notes (Signed)
 Pt present with lower b ack pain. Bilateral lower ext pain that radiates into pelvic area. Hx of sciatica. Onset of flare up-  1week ago. Pain has progressively worsen and pt sts she is unable to ambulate.

## 2023-08-23 NOTE — Discharge Instructions (Signed)
 Your back pain is most likely due to a muscular strain.  There is been a lot of research on back pain, unfortunately the only thing that seems to really help is Tylenol  and ibuprofen .  Relative rest is also important to not lift greater than 10 pounds bending or twisting at the waist.  Please follow-up with your family physician.  The other thing that really seems to benefit patients is physical therapy which your doctor may send you for.  Please return to the emergency department for new numbness or weakness to your arms or legs. Difficulty with urinating or urinating or pooping on yourself.  Also if you cannot feel toilet paper when you wipe or get a fever.   Take 4 over the counter ibuprofen  tablets 3 times a day or 2 over-the-counter naproxen tablets twice a day for pain. Also take tylenol  1000mg (2 extra strength) four times a day.   Try these stretches [email or web address]

## 2023-08-24 NOTE — ED Provider Notes (Signed)
  EMERGENCY DEPARTMENT AT [facility] Provider Note   CSN: [id number] Arrival date & time: 08/23/23  2118     Patient presents with: Back Pain   Megan Yang is a 38 y.o. female.   38 yo F with a chief complaints of low back pain that radiates down the legs.  This has been going on for a few days.  She has a history of low back pains been going on since she was a teenager.  Usually goes down the left side but it is changed and got on both sides recently.  Also has worsened in intensity.  She denies trauma denies loss of bowel or bladder denies loss of pectoral sensation denies numbness or weakness to the legs.  She denies IV drug use.  They have tried narcotics and muscle relaxants at home but without improvement.  Tried a Thera gun but also without improvement and perhaps some worsening.   Back Pain      Prior to Admission medications   Medication Sig Start Date End Date Taking? Authorizing Provider  cephALEXin  (KEFLEX ) 500 MG capsule Take 1 capsule (500 mg total) by mouth 2 (two) times daily. 01/31/19   Rancour, Garnette, MD  doxycycline  (VIBRAMYCIN ) 100 MG capsule Take 1 capsule (100 mg total) by mouth 2 (two) times daily. 07/13/19   Doretha Folks, MD  ibuprofen  (ADVIL ) 800 MG tablet Take 800 mg by mouth every 6 (six) hours as needed for moderate pain.    [provider]  metoCLOPramide  (REGLAN ) 10 MG tablet Take 1 tablet (10 mg total) by mouth every 6 (six) hours. 04/15/23   Odell Balls, PA-C  omeprazole  (PRILOSEC) 20 MG capsule Take 1 capsule (20 mg total) by mouth daily. 04/15/23   Odell Balls, PA-C  oxyCODONE -acetaminophen  (PERCOCET/ROXICET) 5-325 MG tablet Take 1 tablet by mouth every 6 (six) hours as needed for severe pain. 12/14/19   Donah Riis A, PA-C    Allergies: Flagyl [metronidazole] and Ivp dye [iodinated contrast media]    Review of Systems  Musculoskeletal:  Positive for back pain.    Updated Vital Signs BP 128/85 (BP  Location: Right Arm)   Pulse 62   Temp 98.1 F (36.7 C) (Oral)   Resp 20   Ht 5' 5 (1.651 m)   Wt 91.2 kg   LMP 08/07/2023 (Exact Date)   SpO2 100%   BMI 33.45 kg/m   Physical Exam Vitals and nursing note reviewed.  Constitutional:      General: She is not in acute distress.    Appearance: She is well-developed. She is not diaphoretic.  HENT:     Head: Normocephalic and atraumatic.  Eyes:     Pupils: Pupils are equal, round, and reactive to light.  Cardiovascular:     Rate and Rhythm: Normal rate and regular rhythm.     Heart sounds: No murmur heard.    No friction rub. No gallop.  Pulmonary:     Effort: Pulmonary effort is normal.     Breath sounds: No wheezing or rales.  Abdominal:     General: There is no distension.     Palpations: Abdomen is soft.     Tenderness: There is no abdominal tenderness.  Musculoskeletal:        General: No tenderness.     Cervical back: Normal range of motion and neck supple.     Comments: Pulse motor and sensation intact in bilateral lower extremities.  Reflexes are +1 bilaterally.  No clonus.  Diffuse L-spine paraspinal musculature tenderness on palpation.  Skin:    General: Skin is warm and dry.  Neurological:     Mental Status: She is alert and oriented to person, place, and time.  Psychiatric:        Behavior: Behavior normal.     (all labs ordered are listed, but only abnormal results are displayed) Labs Reviewed  URINALYSIS, ROUTINE W REFLEX MICROSCOPIC  PREGNANCY, URINE    EKG: None  Radiology: No results found.   Procedures   Medications Ordered in the ED  HYDROcodone -acetaminophen  (NORCO/VICODIN) 5-325 MG per tablet 1 tablet (1 tablet Oral Given 08/23/23 2224)  acetaminophen  (TYLENOL ) tablet 325 mg (325 mg Oral Given 08/23/23 2354)  ketorolac  (TORADOL ) 15 MG/ML injection 15 mg (15 mg Intramuscular Given 08/23/23 2354)  oxyCODONE  (Oxy IR/ROXICODONE ) immediate release tablet 5 mg (5 mg Oral Given 08/23/23 2354)   diazepam  (VALIUM ) tablet 5 mg (5 mg Oral Given 08/23/23 2354)                                    Medical Decision Making Risk OTC drugs. Prescription drug management.   38 yo F with a chief complaints of low back pain with radiation down her legs.  This has been going on for a few days now.  She has a history of back pains been going on for years but feels like this has worsened. Newly going down both legs.    UA without infection, upreg negative.   With new red flag of bilateral radiation of her pain I did offer her MRI imaging.  Discussed risks and benefits at bedside with her and she would prefer to go home at this time.  Will treat supportively.  And PCP neurosurgery follow-up.  12:32 AM:  I have discussed the diagnosis/risks/treatment options with the patient.  Evaluation and diagnostic testing in the emergency department does not suggest an emergent condition requiring admission or immediate intervention beyond what has been performed at this time.  They will follow up with PCP. We also discussed returning to the ED immediately if new or worsening sx occur. We discussed the sx which are most concerning (e.g., sudden worsening pain, fever, inability to tolerate by mouth, cauda equina s/sx) that necessitate immediate return. Medications administered to the patient during their visit and any new prescriptions provided to the patient are listed below.  Medications given during this visit Medications  HYDROcodone -acetaminophen  (NORCO/VICODIN) 5-325 MG per tablet 1 tablet (1 tablet Oral Given 08/23/23 2224)  acetaminophen  (TYLENOL ) tablet 325 mg (325 mg Oral Given 08/23/23 2354)  ketorolac  (TORADOL ) 15 MG/ML injection 15 mg (15 mg Intramuscular Given 08/23/23 2354)  oxyCODONE  (Oxy IR/ROXICODONE ) immediate release tablet 5 mg (5 mg Oral Given 08/23/23 2354)  diazepam  (VALIUM ) tablet 5 mg (5 mg Oral Given 08/23/23 2354)     The patient appears reasonably screen and/or stabilized for discharge  and I doubt any other medical condition or other [facility] requiring further screening, evaluation, or treatment in the ED at this time prior to discharge.       Final diagnoses:  Acute bilateral low back pain with bilateral sciatica    ED Discharge Orders     None          Emil Share, DO 08/24/23 9967
